# Patient Record
Sex: Female | Born: 1995 | Race: White | Hispanic: No | Marital: Single | Smoking: Former smoker
Health system: Southern US, Community
[De-identification: ages and names within clinical notes are randomized; demographics above are authoritative.]

## PROBLEM LIST (undated history)

## (undated) ENCOUNTER — Inpatient Hospital Stay (HOSPITAL_COMMUNITY): Payer: Self-pay

## (undated) DIAGNOSIS — N83209 Unspecified ovarian cyst, unspecified side: Secondary | ICD-10-CM

## (undated) DIAGNOSIS — F329 Major depressive disorder, single episode, unspecified: Secondary | ICD-10-CM

## (undated) DIAGNOSIS — A749 Chlamydial infection, unspecified: Secondary | ICD-10-CM

## (undated) DIAGNOSIS — Z789 Other specified health status: Secondary | ICD-10-CM

## (undated) DIAGNOSIS — B999 Unspecified infectious disease: Secondary | ICD-10-CM

## (undated) DIAGNOSIS — F32A Depression, unspecified: Secondary | ICD-10-CM

## (undated) HISTORY — PX: TONSILLECTOMY: SUR1361

---

## 2018-10-21 ENCOUNTER — Other Ambulatory Visit: Payer: Self-pay

## 2018-10-21 ENCOUNTER — Inpatient Hospital Stay (HOSPITAL_COMMUNITY)
Admission: AD | Admit: 2018-10-21 | Discharge: 2018-10-21 | Disposition: A | Discharge status: Home / Self Care | Payer: Medicaid Other | Attending: Obstetrics & Gynecology | Admitting: Obstetrics & Gynecology

## 2018-10-21 ENCOUNTER — Inpatient Hospital Stay (HOSPITAL_COMMUNITY): Payer: Medicaid Other

## 2018-10-21 ENCOUNTER — Encounter (HOSPITAL_COMMUNITY): Payer: Self-pay

## 2018-10-21 DIAGNOSIS — Z3A09 9 weeks gestation of pregnancy: Secondary | ICD-10-CM

## 2018-10-21 DIAGNOSIS — O219 Vomiting of pregnancy, unspecified: Secondary | ICD-10-CM

## 2018-10-21 DIAGNOSIS — R109 Unspecified abdominal pain: Secondary | ICD-10-CM | POA: Diagnosis not present

## 2018-10-21 DIAGNOSIS — Z87891 Personal history of nicotine dependence: Secondary | ICD-10-CM | POA: Diagnosis not present

## 2018-10-21 DIAGNOSIS — O210 Mild hyperemesis gravidarum: Secondary | ICD-10-CM | POA: Diagnosis not present

## 2018-10-21 DIAGNOSIS — E876 Hypokalemia: Secondary | ICD-10-CM

## 2018-10-21 DIAGNOSIS — O26891 Other specified pregnancy related conditions, first trimester: Secondary | ICD-10-CM | POA: Diagnosis not present

## 2018-10-21 DIAGNOSIS — O26899 Other specified pregnancy related conditions, unspecified trimester: Secondary | ICD-10-CM

## 2018-10-21 HISTORY — DX: Other specified health status: Z78.9

## 2018-10-21 LAB — COMPREHENSIVE METABOLIC PANEL
ALT: 13 U/L (ref 0–44)
AST: 17 U/L (ref 15–41)
Albumin: 3.5 g/dL (ref 3.5–5.0)
Alkaline Phosphatase: 40 U/L (ref 38–126)
Anion gap: 9 (ref 5–15)
BUN: 9 mg/dL (ref 6–20)
CO2: 25 mmol/L (ref 22–32)
Calcium: 8.7 mg/dL — ABNORMAL LOW (ref 8.9–10.3)
Chloride: 100 mmol/L (ref 98–111)
Creatinine, Ser: 0.56 mg/dL (ref 0.44–1.00)
GFR calc Af Amer: >60 mL/min (ref >60)
GFR calc non Af Amer: >60 mL/min (ref >60)
Glucose, Bld: 77 mg/dL (ref 70–99)
POTASSIUM: 3.4 mmol/L — AB (ref 3.5–5.1)
Sodium: 134 mmol/L — ABNORMAL LOW (ref 135–145)
Total Bilirubin: 0.6 mg/dL (ref 0.3–1.2)
Total Protein: 6.2 g/dL — ABNORMAL LOW (ref 6.5–8.1)

## 2018-10-21 LAB — URINALYSIS, ROUTINE W REFLEX MICROSCOPIC
Bilirubin Urine: NEGATIVE
Glucose, UA: NEGATIVE mg/dL
Hgb urine dipstick: NEGATIVE
Ketones, ur: 20 mg/dL — AB
Nitrite: NEGATIVE
Protein, ur: NEGATIVE mg/dL
Specific Gravity, Urine: 1.025 (ref 1.005–1.030)
pH: 6 (ref 5.0–8.0)

## 2018-10-21 LAB — HCG, QUANTITATIVE, PREGNANCY: hCG, Beta Chain, Quant, S: 122330 m[IU]/mL — ABNORMAL HIGH (ref <5)

## 2018-10-21 MED ORDER — LACTATED RINGERS IV BOLUS
1000.0000 mL | Freq: Once | INTRAVENOUS | Status: AC
  Administered 2018-10-21: 1000 mL via INTRAVENOUS

## 2018-10-21 MED ORDER — ONDANSETRON HCL 4 MG/2ML IJ SOLN
4.0000 mg | Freq: Once | INTRAMUSCULAR | Status: AC
  Administered 2018-10-21: 4 mg via INTRAVENOUS
  Filled 2018-10-21: qty 2

## 2018-10-21 MED ORDER — POTASSIUM CHLORIDE 10 MEQ/100ML IV SOLN
10.0000 meq | INTRAVENOUS | Status: AC
  Administered 2018-10-21 (×2): 10 meq via INTRAVENOUS
  Filled 2018-10-21 (×2): qty 100

## 2018-10-21 MED ORDER — ONDANSETRON 8 MG PO TBDP: 8.0000 mg | ORAL_TABLET | Freq: Three times a day (TID) | ORAL | 0 refills | Status: DC | PRN

## 2018-10-21 MED ORDER — PROMETHAZINE HCL 25 MG PO TABS: 25.0000 mg | ORAL_TABLET | Freq: Four times a day (QID) | ORAL | 0 refills | Status: DC | PRN

## 2018-10-21 MED ORDER — DEXTROSE 5 % IN LACTATED RINGERS IV BOLUS
1000.0000 mL | Freq: Once | INTRAVENOUS | Status: AC
  Administered 2018-10-21: 1000 mL via INTRAVENOUS

## 2018-10-21 MED ORDER — PROMETHAZINE HCL 25 MG/ML IJ SOLN
25.0000 mg | Freq: Once | INTRAMUSCULAR | Status: AC
  Administered 2018-10-21: 25 mg via INTRAVENOUS
  Filled 2018-10-21: qty 1

## 2018-10-21 NOTE — Discharge Instructions (Signed)

## 2018-10-21 NOTE — MAU Provider Note (Addendum)
History     CSN: [id number]  Arrival date and time: 10/21/18 1508   None     Chief Complaint  Patient presents with  . Emesis   HPI   Kara Barron is a 22 y.o. G1P0 female at 1731w1d by LMP who presents to MAU with emesis x3 weeks. Has not yet established care for pregnancy. She reports she has been vomiting throughout the day every day and is constantly nauseous. Endorses some abdominal pain and cramping, not able to categorize exact location or severity. Denies vaginal bleeding. Has not tried any medications. Has tried ginger, gingerale and lemon for nausea without any relief. Believes she has lost some weight but not sure how many lbs.   OB History    Gravida  1   Para      Term      Preterm      AB      Living        SAB      TAB      Ectopic      Multiple      Live Births              Past Medical History:  Diagnosis Date  . Medical history non-contributory     Past Surgical History:  Procedure Laterality Date  . TONSILLECTOMY      History reviewed. No pertinent family history.  Social History   Tobacco Use  . Smoking status: Former Games developermoker  . Smokeless tobacco: Never Used  Substance Use Topics  . Alcohol use: Not Currently  . Drug use: Never    Allergies: No Known Allergies  No medications prior to admission.    Review of Systems  Constitutional: Negative for chills and fever.  HENT: Negative for congestion and sore throat.   Respiratory: Negative for cough and shortness of breath.   Cardiovascular: Negative for chest pain.  Gastrointestinal: Positive for abdominal pain, nausea and vomiting. Negative for constipation and diarrhea.  Genitourinary: Negative for difficulty urinating, dysuria, flank pain, vaginal bleeding and vaginal discharge.  Musculoskeletal: Negative for back pain.  Skin: Negative for rash.  Neurological: Negative for dizziness and light-headedness.   Physical Exam   Blood pressure 128/88, pulse 85,  temperature 98.5 F (36.9 C), temperature source Oral, resp. rate 18, weight 59.5 kg, last menstrual period 08/18/2018, SpO2 100 %.  Physical Exam  Constitutional: She is oriented to person, place, and time. She appears well-developed and well-nourished. No distress.  Appears ill but non-toxic. Bucket on bed with clear emesis present.   HENT:  Head: Normocephalic and atraumatic.  Eyes: Conjunctivae and EOM are normal.  Neck: Normal range of motion. Neck supple.  Cardiovascular: Normal rate and regular rhythm.  Respiratory: Effort normal and breath sounds normal. No respiratory distress.  GI: Soft. Bowel sounds are normal. She exhibits no distension. There is no abdominal tenderness. There is no rebound and no guarding.  Musculoskeletal: Normal range of motion.        General: No edema.  Neurological: She is alert and oriented to person, place, and time.  Skin: Skin is warm and dry. She is not diaphoretic.  Psychiatric: She has a normal mood and affect. Her behavior is normal.    MAU Course  Procedures  MDM 1 LR bolus given. Phenergan 25 mg given. CMET with slightly low K at 3.4 and Na 134; otherwise unremarkable.    [place] Ob Comp Less 14 Wks  Result Date: 10/21/2018 CLINICAL DATA:  Pelvic pain  for 2 weeks. Gestational age by LMP of 9 weeks 1 day. EXAM: OBSTETRIC <14 WK ULTRASOUND TECHNIQUE: Transabdominal ultrasound was performed for evaluation of the gestation as well as the maternal uterus and adnexal regions. COMPARISON:  None. FINDINGS: Intrauterine gestational sac: Single Yolk sac:  Visualized. Embryo:  Visualized. Cardiac Activity: Visualized. Heart Rate: 180 bpm CRL:   24 mm   9 w 1 d                  US EDC: 05/25/2019 Subchorionic hemorrhage:  None visualized. Maternal uterus/adnexae: Normal appearance of left ovary. Right ovary not directly visualized, however no mass or abnormal free fluid identified. IMPRESSION: Single living IUP measuring 9 weeks 1 day, with US EDC of  05/25/2019. No significant maternal uterine or adnexal abnormality identified. Electronically Signed   By: Myles RosenthalJohn  Stahl M.D.   On: 10/21/2018 17:23   On re-assessment, patient feels improved but still ill. Subsequent 1L D5 LR bolus administered due to ketones on UA. 10 mEq KCl runs x2 ordered due to slightly low K.   7:56 PM Care transitioned to [facility] Francille Wittmann, CNM due to shift change.   De HollingsheadCatherine L Wallace 10/21/2018, 4:08 PM   Zofran 4mg  IV given Patient reporting relief from nausea.  1. Nausea and vomiting during pregnancy   2. Abdominal pain in pregnancy   3. Hypokalemia   4. [redacted] weeks gestation of pregnancy    -Discharge home in stable condition -Rx for zofran, phenergan sent to patient's pharmacy -First trimester precautions discussed -Patient advised to follow-up with OB of choice to start prenatal care -Patient may return to MAU as needed or if her condition were to change or worsen  Rolm BookbinderCaroline M Matisyn Cabeza, CNM 10/21/18 9:16 PM

## 2018-10-21 NOTE — MAU Note (Signed)
Has been vomiting for the past 3 wks.  Not on any meds.  Now vomiting blood.  Having a lot of burning.  Has not been seen or had confirmation.  Has appt next Monday.

## 2018-10-28 NOTE — L&D Delivery Note (Signed)
Delivery Note Pt reached complete/complete/+2, with pressure - pushed very well for 10-56min for delivery.  At 5:42 PM a viable and healthy female was delivered via Vaginal, Spontaneous (Presentation: OA; LOT ).  APGAR: 9, 9; weight P .   Placenta status: delivered, intact .  Cord: 3V with the following complications: none .    Anesthesia: epidural  Episiotomy: None Lacerations: 1st degree Suture Repair: 3.0 vicryl rapide Est. Blood Loss (mL): 182    Mom to postpartum.  Baby to Couplet care / Skin to Skin.  Kara Barron 05/12/2019, 6:08 PM  Br/RI/A+/Contra?/needs Tdap  circ in office

## 2018-11-10 LAB — OB RESULTS CONSOLE ABO/RH: RH Type: POSITIVE

## 2018-11-10 LAB — OB RESULTS CONSOLE RUBELLA ANTIBODY, IGM: Rubella: IMMUNE

## 2018-11-10 LAB — OB RESULTS CONSOLE RPR: RPR: NONREACTIVE

## 2018-11-10 LAB — OB RESULTS CONSOLE ANTIBODY SCREEN: Antibody Screen: NEGATIVE

## 2018-11-10 LAB — OB RESULTS CONSOLE GC/CHLAMYDIA
Chlamydia: NEGATIVE
Gonorrhea: NEGATIVE

## 2018-11-10 LAB — OB RESULTS CONSOLE HEPATITIS B SURFACE ANTIGEN: Hepatitis B Surface Ag: NEGATIVE

## 2018-11-10 LAB — OB RESULTS CONSOLE HIV ANTIBODY (ROUTINE TESTING): HIV: NONREACTIVE

## 2018-11-29 ENCOUNTER — Inpatient Hospital Stay (HOSPITAL_COMMUNITY)
Admission: AD | Admit: 2018-11-29 | Discharge: 2018-12-02 | DRG: 832 | Disposition: A | Discharge status: Home / Self Care | Payer: Medicaid Other | Attending: Obstetrics and Gynecology | Admitting: Obstetrics and Gynecology

## 2018-11-29 ENCOUNTER — Other Ambulatory Visit: Payer: Self-pay

## 2018-11-29 ENCOUNTER — Encounter (HOSPITAL_COMMUNITY): Payer: Self-pay | Admitting: *Deleted

## 2018-11-29 DIAGNOSIS — O210 Mild hyperemesis gravidarum: Secondary | ICD-10-CM | POA: Diagnosis present

## 2018-11-29 DIAGNOSIS — O211 Hyperemesis gravidarum with metabolic disturbance: Secondary | ICD-10-CM

## 2018-11-29 DIAGNOSIS — K920 Hematemesis: Secondary | ICD-10-CM | POA: Diagnosis present

## 2018-11-29 DIAGNOSIS — Z3A14 14 weeks gestation of pregnancy: Secondary | ICD-10-CM

## 2018-11-29 DIAGNOSIS — E876 Hypokalemia: Secondary | ICD-10-CM | POA: Diagnosis present

## 2018-11-29 DIAGNOSIS — Z87891 Personal history of nicotine dependence: Secondary | ICD-10-CM

## 2018-11-29 LAB — COMPREHENSIVE METABOLIC PANEL
ALT: 43 U/L (ref 0–44)
AST: 35 U/L (ref 15–41)
Albumin: 3.8 g/dL (ref 3.5–5.0)
Alkaline Phosphatase: 47 U/L (ref 38–126)
Anion gap: 12 (ref 5–15)
BUN: 8 mg/dL (ref 6–20)
CO2: 22 mmol/L (ref 22–32)
Calcium: 8.8 mg/dL — ABNORMAL LOW (ref 8.9–10.3)
Chloride: 100 mmol/L (ref 98–111)
Creatinine, Ser: 0.55 mg/dL (ref 0.44–1.00)
GFR calc Af Amer: >60 mL/min (ref >60)
Glucose, Bld: 124 mg/dL — ABNORMAL HIGH (ref 70–99)
Potassium: 3.1 mmol/L — ABNORMAL LOW (ref 3.5–5.1)
Sodium: 134 mmol/L — ABNORMAL LOW (ref 135–145)
Total Bilirubin: 0.7 mg/dL (ref 0.3–1.2)
Total Protein: 7.4 g/dL (ref 6.5–8.1)

## 2018-11-29 LAB — CBC
HCT: 41 % (ref 36.0–46.0)
Hemoglobin: 14.6 g/dL (ref 12.0–15.0)
MCH: 31.1 pg (ref 26.0–34.0)
MCHC: 35.6 g/dL (ref 30.0–36.0)
MCV: 87.4 fL (ref 80.0–100.0)
Platelets: 313 10*3/uL (ref 150–400)
RBC: 4.69 MIL/uL (ref 3.87–5.11)
RDW: 12.6 % (ref 11.5–15.5)
WBC: 14.1 10*3/uL — ABNORMAL HIGH (ref 4.0–10.5)
nRBC: 0 % (ref 0.0–0.2)

## 2018-11-29 LAB — URINALYSIS, ROUTINE W REFLEX MICROSCOPIC
Bilirubin Urine: NEGATIVE
Glucose, UA: NEGATIVE mg/dL
Hgb urine dipstick: NEGATIVE
Ketones, ur: >80 mg/dL — AB
Leukocytes, UA: NEGATIVE
Nitrite: NEGATIVE
Protein, ur: 30 mg/dL — AB
Specific Gravity, Urine: >1.03 — ABNORMAL HIGH (ref 1.005–1.030)
pH: 6 (ref 5.0–8.0)

## 2018-11-29 LAB — URINALYSIS, MICROSCOPIC (REFLEX)

## 2018-11-29 MED ORDER — DEXTROSE 5 % IN LACTATED RINGERS IV BOLUS
1000.0000 mL | Freq: Once | INTRAVENOUS | Status: AC
  Administered 2018-11-29: 1000 mL via INTRAVENOUS

## 2018-11-29 MED ORDER — METHYLPREDNISOLONE 4 MG PO TABS: 8.0000 mg | ORAL_TABLET | Freq: Every day | ORAL | Status: DC

## 2018-11-29 MED ORDER — METHYLPREDNISOLONE 4 MG PO TABS: 4.0000 mg | ORAL_TABLET | Freq: Every day | ORAL | Status: DC

## 2018-11-29 MED ORDER — METHYLPREDNISOLONE 16 MG PO TABS
16.0000 mg | ORAL_TABLET | Freq: Every day | ORAL | Status: DC
  Administered 2018-11-30 – 2018-12-01 (×2): 16 mg via ORAL
  Filled 2018-11-29 (×4): qty 1

## 2018-11-29 MED ORDER — METOCLOPRAMIDE HCL 5 MG/ML IJ SOLN
10.0000 mg | Freq: Three times a day (TID) | INTRAMUSCULAR | Status: DC | PRN
  Administered 2018-11-29 – 2018-12-01 (×5): 10 mg via INTRAVENOUS
  Filled 2018-11-29 (×5): qty 2

## 2018-11-29 MED ORDER — PROMETHAZINE HCL 25 MG/ML IJ SOLN
12.5000 mg | INTRAMUSCULAR | Status: DC | PRN
  Administered 2018-11-29 – 2018-11-30 (×4): 12.5 mg via INTRAVENOUS
  Filled 2018-11-29 (×4): qty 1

## 2018-11-29 MED ORDER — SODIUM CHLORIDE 0.9 % IV SOLN
8.0000 mg | Freq: Once | INTRAVENOUS | Status: AC
  Administered 2018-11-29: 8 mg via INTRAVENOUS
  Filled 2018-11-29: qty 4

## 2018-11-29 MED ORDER — METHYLPREDNISOLONE 16 MG PO TABS
16.0000 mg | ORAL_TABLET | Freq: Every day | ORAL | Status: AC
  Administered 2018-11-30 – 2018-12-01 (×2): 16 mg via ORAL
  Filled 2018-11-29 (×2): qty 1

## 2018-11-29 MED ORDER — LACTATED RINGERS IV BOLUS
1000.0000 mL | Freq: Once | INTRAVENOUS | Status: AC
  Administered 2018-11-29: 1000 mL via INTRAVENOUS

## 2018-11-29 MED ORDER — PROMETHAZINE HCL 25 MG/ML IJ SOLN
25.0000 mg | Freq: Once | INTRAVENOUS | Status: AC
  Administered 2018-11-29: 25 mg via INTRAVENOUS
  Filled 2018-11-29: qty 1

## 2018-11-29 MED ORDER — POTASSIUM CHLORIDE 10 MEQ/100ML IV SOLN
10.0000 meq | INTRAVENOUS | Status: DC
  Filled 2018-11-29 (×4): qty 100

## 2018-11-29 MED ORDER — METHYLPREDNISOLONE 4 MG PO TABS
8.0000 mg | ORAL_TABLET | Freq: Every day | ORAL | Status: DC
  Administered 2018-12-02: 8 mg via ORAL
  Filled 2018-11-29 (×2): qty 2

## 2018-11-29 MED ORDER — DEXTROSE IN LACTATED RINGERS 5 % IV SOLN
INTRAVENOUS | Status: DC
  Administered 2018-11-29 – 2018-12-02 (×6): via INTRAVENOUS

## 2018-11-29 MED ORDER — METHYLPREDNISOLONE 16 MG PO TABS
16.0000 mg | ORAL_TABLET | Freq: Every day | ORAL | Status: DC
  Administered 2018-11-30 – 2018-12-01 (×2): 16 mg via ORAL
  Filled 2018-11-29 (×3): qty 1

## 2018-11-29 MED ORDER — METHYLPREDNISOLONE SODIUM SUCC 125 MG IJ SOLR
48.0000 mg | Freq: Once | INTRAMUSCULAR | Status: AC
  Administered 2018-11-29: 48 mg via INTRAVENOUS
  Filled 2018-11-29: qty 0.77

## 2018-11-29 MED ORDER — PANTOPRAZOLE SODIUM 40 MG IV SOLR
40.0000 mg | Freq: Once | INTRAVENOUS | Status: AC
  Administered 2018-11-29: 40 mg via INTRAVENOUS
  Filled 2018-11-29: qty 40

## 2018-11-29 MED ORDER — POTASSIUM CHLORIDE 10 MEQ/100ML IV SOLN
10.0000 meq | INTRAVENOUS | Status: AC
  Administered 2018-11-29 – 2018-11-30 (×4): 10 meq via INTRAVENOUS
  Filled 2018-11-29 (×4): qty 100

## 2018-11-29 NOTE — Progress Notes (Signed)
Pharmacy Consult:   MEDROL (METHYLPREDNISOLONE) TAPER  FOR HYPEREMESIS GRAVIDARUM PATIENTS  The following is a 14 day taper of methylprednisolone for hyperemesis. Doses on day 1  will be given IV.  All doses starting on day 2  will be given PO. (If patient cannot tolerate oral medications, contact the pharmacy to change route to IV.)   Date Day Morning Midday Bedtime  2/2 1 - - 48 mg  2/3 2 16  mg 16 mg 16 mg  2/4 3 16  mg 16 mg 16 mg  2/5 4 16  mg 8 mg 16 mg  2/6 5 16  mg 8 mg 8 mg  2/7 6 8  mg 8 mg 8 mg  2/8 7 8  mg 4 mg 8 mg  2/9 8 8  mg 4 mg 4 mg  2/10 9 8  mg 4 mg   2/11 10 8  mg 4 mg   2/12 11 8  mg    2/13 12 8  mg    2/14 13 4  mg    2/15 14 4  mg     Check fasting blood sugars daily while on the taper. Notify MD if fasting blood sugar>95.  Kara Barron 11/29/2018

## 2018-11-29 NOTE — H&P (Signed)
Maha Rearden is a 23 y.o. female G1P0 at 44 5/7 weeks (EDD 05/25/19 by LMP c/w 9 week [place])  presenting for persistent nausea and vomiting x 2 days.  Prenatal care has been significant for hyperemesis but she has been able to control it at home with a scheduled regimen of phenergan and diclegis until recently ran out of meds.  She was hydrated in MAU with 2 liters of LR and given multiple doses of antiemetics without resolution of N/V so will be admitted for further hydration.  Prenatal care otherwise uncomplicated except for h/o depression, stable off medications.  OB History    Gravida  1   Para      Term      Preterm      AB      Living        SAB      TAB      Ectopic      Multiple      Live Births             Past Medical History:  Diagnosis Date  . Medical history non-contributory    Past Surgical History:  Procedure Laterality Date  . TONSILLECTOMY     Family History: family history is not on file. Social History:  reports that she has quit smoking. She has never used smokeless tobacco. She reports previous alcohol use. She reports that she does not use drugs.        Review of Systems  Eyes: Negative for blurred vision.  Gastrointestinal: Positive for nausea and vomiting.   Maternal Medical History:  Reason for admission: Nausea.       Blood pressure 130/82, pulse 90, resp. rate 17, last menstrual period 08/18/2018, SpO2 99 %. Exam Physical Exam  Prenatal labs: ABO, Rh:  A positive Antibody:  negative Rubella:  Immune RPR:   NR HBsAg:   Neg HIV:   NR   Assessment/Plan: Pt admitted for continued hydration and antiemetics.  Had zofran and phenergan in MAU so will try reglan and begin steroid taper.    Oliver Pila 11/29/2018, 7:38 PM

## 2018-11-29 NOTE — MAU Provider Note (Signed)
Chief Complaint: Emesis and Nausea   First Provider Initiated Contact with Patient 11/29/18 1357     SUBJECTIVE HPI: Kara Barron is a 23 y.o. G1P0 at 8442w5d who presents to Maternity Admissions reporting exacerbation of nausea and vomiting of pregnancy and new-onset vomiting dark red blood. Symptoms not adequately controlled with scheduled Zofran, Diclegis and Phenergan. She ran out and was hoping she could make it through until her office visit tomorrow at [facility].    Associated signs and symptoms: Negative for fever, chills, diarrhea, constipation, blood in stools. Unable to void.   Past Medical History:  Diagnosis Date  . Medical history non-contributory    OB History  Gravida Para Term Preterm AB Living  1            SAB TAB Ectopic Multiple Live Births               # Outcome Date GA Lbr Len/2nd Weight Sex Delivery Anes PTL Lv  1 Current            Past Surgical History:  Procedure Laterality Date  . TONSILLECTOMY     Social History   Socioeconomic History  . Marital status: Single    Spouse name: Not on file  . Number of children: Not on file  . Years of education: Not on file  . Highest education level: Not on file  Occupational History  . Not on file  Social Needs  . Financial resource strain: Not on file  . Food insecurity:    Worry: Not on file    Inability: Not on file  . Transportation needs:    Medical: Not on file    Non-medical: Not on file  Tobacco Use  . Smoking status: Former Games developermoker  . Smokeless tobacco: Never Used  Substance and Sexual Activity  . Alcohol use: Not Currently  . Drug use: Never  . Sexual activity: Yes    Birth control/protection: None  Lifestyle  . Physical activity:    Days per week: Not on file    Minutes per session: Not on file  . Stress: Not on file  Relationships  . Social connections:    Talks on phone: Not on file    Gets together: Not on file    Attends religious service: Not on file    Active  member of club or organization: Not on file    Attends meetings of clubs or organizations: Not on file    Relationship status: Not on file  . Intimate partner violence:    Fear of current or ex partner: Not on file    Emotionally abused: Not on file    Physically abused: Not on file    Forced sexual activity: Not on file  Other Topics Concern  . Not on file  Social History Narrative  . Not on file   History reviewed. No pertinent family history. No current facility-administered medications on file prior to encounter.    Current Outpatient Medications on File Prior to Encounter  Medication Sig Dispense Refill  . ondansetron (ZOFRAN ODT) 8 MG disintegrating tablet Take 1 tablet (8 mg total) by mouth every 8 (eight) hours as needed for nausea or vomiting. 30 tablet 0  . promethazine (PHENERGAN) 25 MG tablet Take 1 tablet (25 mg total) by mouth every 6 (six) hours as needed for nausea or vomiting. 30 tablet 0   No Known Allergies  I have reviewed patient's Past Medical Hx, Surgical Hx, Family Hx, Social Hx,  medications and allergies.   Review of Systems  Constitutional: Negative for appetite change, chills and fever.  Gastrointestinal: Positive for nausea and vomiting. Negative for abdominal pain, blood in stool, constipation and diarrhea.       Positive for what appears to be dark red blood in emesis  Genitourinary: Negative for vaginal bleeding.    OBJECTIVE Patient Vitals for the past 24 hrs:  BP Temp Pulse Resp SpO2  11/29/18 1419 130/82 - 90 17 99 %     Constitutional: Well-developed, well-nourished female in distress. Vomiting repeatedly.  Cardiovascular: normal rate Respiratory: normal rate and effort.  GI: Deferred Neurologic: Alert and oriented x 4.  GU: Deferred  Fetal heart rate 155 by Doppler  LAB RESULTS Results for orders placed or performed during the hospital encounter of 11/29/18 (from the past 24 hour(s))  Urinalysis, Routine w reflex microscopic      Status: Abnormal   Collection Time: 11/29/18  1:48 PM  Result Value Ref Range   Color, Urine YELLOW YELLOW   APPearance CLOUDY (A) CLEAR   Specific Gravity, Urine >1.030 (H) 1.005 - 1.030   pH 6.0 5.0 - 8.0   Glucose, UA NEGATIVE NEGATIVE mg/dL   Hgb urine dipstick NEGATIVE NEGATIVE   Bilirubin Urine NEGATIVE NEGATIVE   Ketones, ur >80 (A) NEGATIVE mg/dL   Protein, ur 30 (A) NEGATIVE mg/dL   Nitrite NEGATIVE NEGATIVE   Leukocytes, UA NEGATIVE NEGATIVE  Urinalysis, Microscopic (reflex)     Status: Abnormal   Collection Time: 11/29/18  1:48 PM  Result Value Ref Range   RBC / HPF 0-5 0 - 5 RBC/hpf   WBC, UA 0-5 0 - 5 WBC/hpf   Bacteria, UA FEW (A) NONE SEEN   Squamous Epithelial / LPF 11-20 0 - 5   Amorphous Crystal PRESENT   CBC     Status: Abnormal   Collection Time: 11/29/18  2:16 PM  Result Value Ref Range   WBC 14.1 (H) 4.0 - 10.5 K/uL   RBC 4.69 3.87 - 5.11 MIL/uL   Hemoglobin 14.6 12.0 - 15.0 g/dL   HCT 19.1 47.8 - 29.5 %   MCV 87.4 80.0 - 100.0 fL   MCH 31.1 26.0 - 34.0 pg   MCHC 35.6 30.0 - 36.0 g/dL   RDW 62.1 30.8 - 65.7 %   Platelets 313 150 - 400 K/uL   nRBC 0.0 0.0 - 0.2 %  Comprehensive metabolic panel     Status: Abnormal   Collection Time: 11/29/18  2:35 PM  Result Value Ref Range   Sodium 134 (L) 135 - 145 mmol/L   Potassium 3.1 (L) 3.5 - 5.1 mmol/L   Chloride 100 98 - 111 mmol/L   CO2 22 22 - 32 mmol/L   Glucose, Bld 124 (H) 70 - 99 mg/dL   BUN 8 6 - 20 mg/dL   Creatinine, Ser 8.46 0.44 - 1.00 mg/dL   Calcium 8.8 (L) 8.9 - 10.3 mg/dL   Total Protein 7.4 6.5 - 8.1 g/dL   Albumin 3.8 3.5 - 5.0 g/dL   AST 35 15 - 41 U/L   ALT 43 0 - 44 U/L   Alkaline Phosphatase 47 38 - 126 U/L   Total Bilirubin 0.7 0.3 - 1.2 mg/dL   GFR calc non Af Amer >60 >60 mL/min   GFR calc Af Amer >60 >60 mL/min   Anion gap 12 5 - 15    IMAGING No results found.  MAU COURSE Orders Placed This Encounter  Procedures  .  CBC  . Comprehensive metabolic panel  .  Urinalysis, Routine w reflex microscopic  . Insert peripheral IV   Meds ordered this encounter  Medications  . promethazine (PHENERGAN) 25 mg in lactated ringers 1,000 mL infusion  . ondansetron (ZOFRAN) 8 mg in sodium chloride 0.9 % 50 mL IVPB  . lactated ringers bolus 1,000 mL  . pantoprazole (PROTONIX) injection 40 mg  . ondansetron (ZOFRAN) 8 mg in sodium chloride 0.9 % 50 mL IVPB  . dextrose 5% lactated ringers bolus 1,000 mL  . potassium chloride 10 mEq in 100 mL IVPB   Vomiting stopped for ~ 1.5 hours. Pt sleeping during that time. Woke up and said she was still very nauseous, then started vomiting. Only voided tiny amount since receiving 2 liters of LR.    Discussed Hx, labs, exam w/ Dr. Erin Fulling. Agrees w/ POC. Recommend discussing admission with [facility] on-call physician. Will replace K.   Discussed Hx, labs, exam w/ Dr. Senaida Ores. Will admit to 3rd floor.   MDM - Hyperemesis uncontrolled w/ Phenergan, two doses of Zofran. Actively vomiting.  - Possible hematemesis. Not C/W Mallory-Weiss Tear. - Hypokalemia. Replace K.    ASSESSMENT 1. Hyperemesis gravidarum before end of [redacted] week gestation with electrolyte imbalance   2.      Hypokalemia 3.      Hematemesis   PLAN Admit to third floor per consult w/ Dr. Senaida Ores. Medrol taper Replace Kevin Fenton, [place], [place] 11/29/2018  6:13 PM

## 2018-11-29 NOTE — MAU Note (Signed)
Pt states she has had severe nausea and vomiting the whole pregnancy.  She was on medications but ran out and it has gotten worse.  Denies LOF/VB/pain.

## 2018-11-30 LAB — COMPREHENSIVE METABOLIC PANEL
ALT: 58 U/L — ABNORMAL HIGH (ref 0–44)
AST: 39 U/L (ref 15–41)
Albumin: 3.3 g/dL — ABNORMAL LOW (ref 3.5–5.0)
Alkaline Phosphatase: 43 U/L (ref 38–126)
Anion gap: 8 (ref 5–15)
CO2: 23 mmol/L (ref 22–32)
Calcium: 8.3 mg/dL — ABNORMAL LOW (ref 8.9–10.3)
Chloride: 106 mmol/L (ref 98–111)
Creatinine, Ser: 0.45 mg/dL (ref 0.44–1.00)
GFR calc Af Amer: >60 mL/min (ref >60)
GFR calc non Af Amer: >60 mL/min (ref >60)
Glucose, Bld: 129 mg/dL — ABNORMAL HIGH (ref 70–99)
POTASSIUM: 2.7 mmol/L — AB (ref 3.5–5.1)
Sodium: 137 mmol/L (ref 135–145)
Total Bilirubin: 0.4 mg/dL (ref 0.3–1.2)
Total Protein: 6.4 g/dL — ABNORMAL LOW (ref 6.5–8.1)

## 2018-11-30 LAB — GLUCOSE, RANDOM: Glucose, Bld: 147 mg/dL — ABNORMAL HIGH (ref 70–99)

## 2018-11-30 LAB — GLUCOSE, CAPILLARY: Glucose-Capillary: 109 mg/dL — ABNORMAL HIGH (ref 70–99)

## 2018-11-30 MED ORDER — ONDANSETRON 4 MG PO TBDP
4.0000 mg | ORAL_TABLET | Freq: Once | ORAL | Status: AC
  Administered 2018-11-30: 4 mg via ORAL
  Filled 2018-11-30: qty 1

## 2018-11-30 MED ORDER — SODIUM CHLORIDE 0.9 % IV SOLN
8.0000 mg | Freq: Once | INTRAVENOUS | Status: AC
  Administered 2018-12-01: 8 mg via INTRAVENOUS
  Filled 2018-11-30: qty 4

## 2018-11-30 MED ORDER — PROMETHAZINE HCL 25 MG/ML IJ SOLN
25.0000 mg | INTRAMUSCULAR | Status: DC | PRN
  Administered 2018-12-01 (×2): 25 mg via INTRAVENOUS
  Filled 2018-11-30 (×2): qty 1

## 2018-11-30 MED ORDER — THIAMINE HCL 100 MG/ML IJ SOLN
Freq: Once | INTRAVENOUS | Status: AC
  Administered 2018-12-01: via INTRAVENOUS
  Filled 2018-11-30: qty 1000

## 2018-11-30 NOTE — Progress Notes (Addendum)
Patient ID: Kara Barron, female   DOB: 1996-06-17, 23 y.o.   MRN: [id number] Pt has been unable to tolerate liquids PO. Dry heaving now. Feels weak. Scared to eat VS - 100/59  A/P: Prime at 14 6/7wks with hyperemesis         Recheck labs now and in am         Continue on dextrose LR         Continue with medrol ( 16mg  today and for next 4 days then taper to 8mg  x 1 week then 4mg  for 2 days)          Increase phenergan to 25mg  and add zofran ; reglan prn          Change to regular diet but bland    Counseled pt on importance of attempting to eat; symptoms will worsen otherwise    Switch to bland diet: pt to order meal            FHTs q shift

## 2018-11-30 NOTE — Progress Notes (Signed)
Patient ID: Kara Barron, female   DOB: 1996/10/20, 23 y.o.   MRN: [id number] Pt doing well. No vomiting >3hrs. Has not eaten anything though - scared to VSS GEN - NAD  A/P: 22yo prime at 67 6/[redacted]wks gestation with hyperemesis - now stable         Encouraged pr to eat this am - try clears then advance by lunch         If able to tolerate po, will discharge to home today         Adjust anti nausea meds accordingly; continue steroids

## 2018-11-30 NOTE — Progress Notes (Signed)
Pt crying and vomiting in trash can.  Reglan 10 mg Slow IVP given.

## 2018-12-01 DIAGNOSIS — O210 Mild hyperemesis gravidarum: Secondary | ICD-10-CM | POA: Diagnosis present

## 2018-12-01 DIAGNOSIS — Z3A14 14 weeks gestation of pregnancy: Secondary | ICD-10-CM | POA: Diagnosis not present

## 2018-12-01 DIAGNOSIS — K920 Hematemesis: Secondary | ICD-10-CM | POA: Diagnosis present

## 2018-12-01 DIAGNOSIS — O211 Hyperemesis gravidarum with metabolic disturbance: Secondary | ICD-10-CM | POA: Diagnosis present

## 2018-12-01 DIAGNOSIS — Z87891 Personal history of nicotine dependence: Secondary | ICD-10-CM | POA: Diagnosis not present

## 2018-12-01 LAB — COMPREHENSIVE METABOLIC PANEL
ALT: 65 U/L — ABNORMAL HIGH (ref 0–44)
ANION GAP: 7 (ref 5–15)
AST: 43 U/L — ABNORMAL HIGH (ref 15–41)
Albumin: 3.4 g/dL — ABNORMAL LOW (ref 3.5–5.0)
Alkaline Phosphatase: 37 U/L — ABNORMAL LOW (ref 38–126)
BUN: <5 mg/dL — ABNORMAL LOW (ref 6–20)
CO2: 24 mmol/L (ref 22–32)
Calcium: 8.3 mg/dL — ABNORMAL LOW (ref 8.9–10.3)
Chloride: 103 mmol/L (ref 98–111)
Creatinine, Ser: 0.42 mg/dL — ABNORMAL LOW (ref 0.44–1.00)
GFR calc Af Amer: >60 mL/min (ref >60)
GFR calc non Af Amer: >60 mL/min (ref >60)
GLUCOSE: 124 mg/dL — AB (ref 70–99)
POTASSIUM: 2.9 mmol/L — AB (ref 3.5–5.1)
Sodium: 134 mmol/L — ABNORMAL LOW (ref 135–145)
TOTAL PROTEIN: 6.3 g/dL — AB (ref 6.5–8.1)
Total Bilirubin: 0.6 mg/dL (ref 0.3–1.2)

## 2018-12-01 LAB — GLUCOSE, CAPILLARY: Glucose-Capillary: 111 mg/dL — ABNORMAL HIGH (ref 70–99)

## 2018-12-01 MED ORDER — PANTOPRAZOLE SODIUM 40 MG IV SOLR
40.0000 mg | Freq: Two times a day (BID) | INTRAVENOUS | Status: DC
  Administered 2018-12-01 – 2018-12-02 (×3): 40 mg via INTRAVENOUS
  Filled 2018-12-01 (×5): qty 40

## 2018-12-01 MED ORDER — ONDANSETRON HCL 4 MG/2ML IJ SOLN
4.0000 mg | Freq: Three times a day (TID) | INTRAMUSCULAR | Status: DC
  Administered 2018-12-01 – 2018-12-02 (×4): 4 mg via INTRAVENOUS
  Filled 2018-12-01 (×4): qty 2

## 2018-12-01 MED ORDER — POTASSIUM CHLORIDE 10 MEQ/100ML IV SOLN
10.0000 meq | INTRAVENOUS | Status: AC
  Administered 2018-12-01 (×4): 10 meq via INTRAVENOUS
  Filled 2018-12-01 (×4): qty 100

## 2018-12-01 MED ORDER — CHLORPROMAZINE HCL 25 MG/ML IJ SOLN
25.0000 mg | Freq: Three times a day (TID) | INTRAMUSCULAR | Status: DC
  Administered 2018-12-01 (×2): 25 mg via INTRAMUSCULAR
  Filled 2018-12-01 (×4): qty 1

## 2018-12-01 NOTE — Progress Notes (Addendum)
HD #2, [redacted]W[redacted]D, hyperemesis Still not feeling good, finally resting, reports last emesis at 0530, trash can by the bed Afeb, VSS Labs- K+ still low at 2.9, LFTs slightly elevated Will continue Medrol taper, Phenergan prn, will give zofran every 8 hrs scheduled as was just ordered one time, will give IV K+ since not tolerating PO yet.  Reassess later today

## 2018-12-01 NOTE — Progress Notes (Signed)
Maybe feeling a bit better, but has had emesis since I saw her this morning.  Having esophageal burning Will continue scheduled Medrol, Reglan and Zofran, d/c phenergan to avoid dystonic rxn, add IV protonix, finish IV K+

## 2018-12-01 NOTE — Progress Notes (Signed)
No significant improvement so far Will d/c Reglan and try thorazine, continue Medrol and zofran Recheck CMP in am

## 2018-12-02 LAB — COMPREHENSIVE METABOLIC PANEL
ALT: 96 U/L — ABNORMAL HIGH (ref 0–44)
ALT: 110 U/L — ABNORMAL HIGH (ref 0–44)
AST: 66 U/L — ABNORMAL HIGH (ref 15–41)
AST: 61 U/L — ABNORMAL HIGH (ref 15–41)
Albumin: 3 g/dL — ABNORMAL LOW (ref 3.5–5.0)
Albumin: 3.1 g/dL — ABNORMAL LOW (ref 3.5–5.0)
Alkaline Phosphatase: 40 U/L (ref 38–126)
Alkaline Phosphatase: 45 U/L (ref 38–126)
Anion gap: 8 (ref 5–15)
Anion gap: 5 (ref 5–15)
BUN: <5 mg/dL — ABNORMAL LOW (ref 6–20)
BUN: <5 mg/dL — ABNORMAL LOW (ref 6–20)
CHLORIDE: 103 mmol/L (ref 98–111)
CO2: 23 mmol/L (ref 22–32)
CO2: 24 mmol/L (ref 22–32)
Calcium: 8.1 mg/dL — ABNORMAL LOW (ref 8.9–10.3)
Calcium: 8.3 mg/dL — ABNORMAL LOW (ref 8.9–10.3)
Chloride: 106 mmol/L (ref 98–111)
Creatinine, Ser: 0.48 mg/dL (ref 0.44–1.00)
Creatinine, Ser: 0.37 mg/dL — ABNORMAL LOW (ref 0.44–1.00)
GFR calc Af Amer: >60 mL/min (ref >60)
GFR calc non Af Amer: >60 mL/min (ref >60)
Glucose, Bld: 96 mg/dL (ref 70–99)
Glucose, Bld: 99 mg/dL (ref 70–99)
Potassium: 2.7 mmol/L — CL (ref 3.5–5.1)
Potassium: 4 mmol/L (ref 3.5–5.1)
SODIUM: 135 mmol/L (ref 135–145)
Sodium: 134 mmol/L — ABNORMAL LOW (ref 135–145)
Total Bilirubin: 0.5 mg/dL (ref 0.3–1.2)
Total Bilirubin: 0.6 mg/dL (ref 0.3–1.2)
Total Protein: 5.8 g/dL — ABNORMAL LOW (ref 6.5–8.1)
Total Protein: 5.7 g/dL — ABNORMAL LOW (ref 6.5–8.1)

## 2018-12-02 LAB — GLUCOSE, CAPILLARY: Glucose-Capillary: 99 mg/dL (ref 70–99)

## 2018-12-02 MED ORDER — POTASSIUM CHLORIDE CRYS ER 20 MEQ PO TBCR
40.0000 meq | EXTENDED_RELEASE_TABLET | Freq: Three times a day (TID) | ORAL | Status: DC
  Administered 2018-12-02: 40 meq via ORAL
  Filled 2018-12-02 (×4): qty 2

## 2018-12-02 MED ORDER — PANTOPRAZOLE SODIUM 40 MG PO TBEC: 40.0000 mg | DELAYED_RELEASE_TABLET | Freq: Two times a day (BID) | ORAL | Status: DC

## 2018-12-02 MED ORDER — METHYLPREDNISOLONE 4 MG PO TABS: ORAL_TABLET | ORAL | 0 refills | Status: DC

## 2018-12-02 MED ORDER — METHYLPREDNISOLONE 8 MG PO TABS: ORAL_TABLET | ORAL | 0 refills | Status: DC

## 2018-12-02 MED ORDER — ONDANSETRON 8 MG PO TBDP: 8.0000 mg | ORAL_TABLET | Freq: Three times a day (TID) | ORAL | 3 refills | Status: DC | PRN

## 2018-12-02 MED ORDER — PANTOPRAZOLE SODIUM 40 MG PO TBEC: 40.0000 mg | DELAYED_RELEASE_TABLET | Freq: Two times a day (BID) | ORAL | 4 refills | Status: AC

## 2018-12-02 MED ORDER — POTASSIUM CHLORIDE 10 MEQ/100ML IV SOLN
10.0000 meq | INTRAVENOUS | Status: DC
  Administered 2018-12-02 (×5): 10 meq via INTRAVENOUS
  Filled 2018-12-02 (×6): qty 100

## 2018-12-02 MED ORDER — ONDANSETRON 8 MG PO TBDP
8.0000 mg | ORAL_TABLET | Freq: Three times a day (TID) | ORAL | Status: DC | PRN
  Administered 2018-12-02: 8 mg via ORAL
  Filled 2018-12-02 (×2): qty 1

## 2018-12-02 NOTE — Progress Notes (Signed)
Discharge instructions reviewed with pt and family. Verbalized understanding, denied any questions at this time.

## 2018-12-02 NOTE — Progress Notes (Signed)
Patient ID: Kara Barron, female   DOB: 06-27-1996, 23 y.o.   MRN: [id number] HD #3   Pt finally feeling some better this AM, no emesis thus far.  Ate small breakfast and tolerated thus far.  afeb vss abd soft NT  Current regimen is scheduled zofran q 8 hours IV and protonix.  Also steroid dose pack Will add oral Kdur and recheck potassium this PM.   If continues to do well will start transitioning IV to oral meds d/c thorazine

## 2018-12-02 NOTE — Progress Notes (Signed)
Patient ID: Kara Barron, female   DOB: Jun 19, 1996, 23 y.o.   MRN: [id number] Pt has had a good day with no emesis and tolerating po intake for breakfast and lunch.  About to try dinner.    She has had IV saline locked since lunch time and changed to all po meds.   afeb vss CMP repeated this PM and potassium up to 4.0, LFT's still slightly elevated but stable   Will d/c pt to home on current regimen of zofran scheduled, protonix and steroid taper.  She knows to call if begins to vomit again.  Has appt in office 12/09/18

## 2018-12-02 NOTE — Discharge Summary (Signed)
Physician Discharge Summary  Patient ID: Kara Barron MRN: [id number] DOB/AGE: [birth year] 23 y.o.  Admit date: 11/29/2018 Discharge date: 12/02/2018  Admission Diagnoses: Hyperemesis at 14 5/7 weeks Hypokalemia Discharge Diagnoses:  Active Problems:   Hyperemesis complicating pregnancy, antepartum   Hypokalemia due to excessive gastrointestinal loss of potassium   Hematemesis   Discharged Condition: good  Hospital Course: Pt was admitted with IV hydration and antiemetics which took over 48 hours to resolve.  She was begun on a steroid taper, and had several medications tried to control her vomiting.  Ultimately, scheduled zofran every 8 hours. Protonix, and the steroid taper controlled her vomiting.  She also had significant hypokalemia and received multiple K-runs and some oral Kdur to correct.  Discharge potassium was 4.0.  Her LFT's were slightly elevated, but stable, so will just f/u outpatient.    Consults: None  Significant Diagnostic Studies: labs: cbc cmp  Treatments: IV hydration  Discharge Exam: Blood pressure 99/60, pulse 95, temperature 98.9 F (37.2 C), temperature source Oral, resp. rate 18, height 5\' 3"  (1.6 m), weight 56.8 kg, last menstrual period 08/18/2018, SpO2 100 %. General appearance: alert and cooperative GI: soft NT  Disposition: Discharge disposition: 01-Home or Self Care       Discharge Instructions    Activity as tolerated   Complete by:  As directed    Call MD for:  persistant nausea and vomiting   Complete by:  As directed    Discharge instructions   Complete by:  As directed    Bland diet as tolerated.  Continue to drink plenty of clear liquids.     Allergies as of 12/02/2018   No Known Allergies     Medication List    STOP taking these medications   promethazine 25 MG tablet Commonly known as:  PHENERGAN     TAKE these medications   methylPREDNISolone 8 MG tablet Commonly known as:  MEDROL Take 2 tablets three x daily on  12/03/18 Take one tablet 3 times daily from 12/04/18-12/06/18   methylPREDNISolone 4 MG tablet Commonly known as:  MEDROL Take one tablet 3 times daily from 12/07/18-12/10/18 Take one tablet 2 times daily from 12/11/18-12/14/18   ondansetron 8 MG disintegrating tablet Commonly known as:  ZOFRAN ODT Take 1 tablet (8 mg total) by mouth every 8 (eight) hours as needed for nausea or vomiting.   pantoprazole 40 MG tablet Commonly known as:  PROTONIX Take 1 tablet (40 mg total) by mouth 2 (two) times daily.      Follow-up Information    Edwinna Areola, DO Follow up in 1 week(s).   Specialty:  Obstetrics and Gynecology Why:  Keep appointment as scheduled 12/09/18 Contact information: [address] [place] [address] [place] [address] [phone]           Signed: Oliver Pila 12/02/2018, 6:55 PM

## 2018-12-02 NOTE — Progress Notes (Signed)
CRITICAL VALUE ALERT  Critical Value: potassium 2.7  Date & Time Notied:  12-02-18 @ 0620  Provider Notified: Dr. Jackelyn Knife  Orders Received/Actions taken: 6 runs of Potassium

## 2018-12-19 ENCOUNTER — Inpatient Hospital Stay (HOSPITAL_COMMUNITY)
Admission: AD | Admit: 2018-12-19 | Discharge: 2018-12-19 | Disposition: A | Discharge status: Home / Self Care | Payer: Medicaid Other | Source: Ambulatory Visit | Attending: Obstetrics and Gynecology | Admitting: Obstetrics and Gynecology

## 2018-12-19 ENCOUNTER — Encounter (HOSPITAL_COMMUNITY): Payer: Self-pay

## 2018-12-19 ENCOUNTER — Other Ambulatory Visit: Payer: Self-pay

## 2018-12-19 DIAGNOSIS — Z3A17 17 weeks gestation of pregnancy: Secondary | ICD-10-CM | POA: Insufficient documentation

## 2018-12-19 DIAGNOSIS — O210 Mild hyperemesis gravidarum: Secondary | ICD-10-CM | POA: Diagnosis present

## 2018-12-19 DIAGNOSIS — Z87891 Personal history of nicotine dependence: Secondary | ICD-10-CM | POA: Insufficient documentation

## 2018-12-19 LAB — URINALYSIS, ROUTINE W REFLEX MICROSCOPIC
Bilirubin Urine: NEGATIVE
Glucose, UA: NEGATIVE mg/dL
Hgb urine dipstick: NEGATIVE
Ketones, ur: >80 mg/dL — AB
Nitrite: NEGATIVE
PROTEIN: NEGATIVE mg/dL
Specific Gravity, Urine: 1.025 (ref 1.005–1.030)
pH: 6.5 (ref 5.0–8.0)

## 2018-12-19 LAB — COMPREHENSIVE METABOLIC PANEL
ALT: 16 U/L (ref 0–44)
AST: 21 U/L (ref 15–41)
Albumin: 3.2 g/dL — ABNORMAL LOW (ref 3.5–5.0)
Alkaline Phosphatase: 53 U/L (ref 38–126)
Anion gap: 9 (ref 5–15)
BUN: 5 mg/dL — ABNORMAL LOW (ref 6–20)
CO2: 23 mmol/L (ref 22–32)
Calcium: 8.3 mg/dL — ABNORMAL LOW (ref 8.9–10.3)
Chloride: 102 mmol/L (ref 98–111)
Creatinine, Ser: 0.5 mg/dL (ref 0.44–1.00)
GFR calc Af Amer: >60 mL/min (ref >60)
GFR calc non Af Amer: >60 mL/min (ref >60)
Glucose, Bld: 311 mg/dL — ABNORMAL HIGH (ref 70–99)
POTASSIUM: 3.5 mmol/L (ref 3.5–5.1)
SODIUM: 134 mmol/L — AB (ref 135–145)
Total Bilirubin: 0.6 mg/dL (ref 0.3–1.2)
Total Protein: 6 g/dL — ABNORMAL LOW (ref 6.5–8.1)

## 2018-12-19 LAB — CBC
HCT: 37.9 % (ref 36.0–46.0)
Hemoglobin: 12.6 g/dL (ref 12.0–15.0)
MCH: 30.4 pg (ref 26.0–34.0)
MCHC: 33.2 g/dL (ref 30.0–36.0)
MCV: 91.5 fL (ref 80.0–100.0)
NRBC: 0 % (ref 0.0–0.2)
Platelets: 214 10*3/uL (ref 150–400)
RBC: 4.14 MIL/uL (ref 3.87–5.11)
RDW: 13.3 % (ref 11.5–15.5)
WBC: 10.6 10*3/uL — ABNORMAL HIGH (ref 4.0–10.5)

## 2018-12-19 LAB — URINALYSIS, MICROSCOPIC (REFLEX): RBC / HPF: NONE SEEN RBC/hpf (ref 0–5)

## 2018-12-19 MED ORDER — DEXTROSE 5 % IN LACTATED RINGERS IV BOLUS
1000.0000 mL | Freq: Once | INTRAVENOUS | Status: AC
  Administered 2018-12-19: 1000 mL via INTRAVENOUS

## 2018-12-19 MED ORDER — PROMETHAZINE HCL 25 MG/ML IJ SOLN
12.5000 mg | Freq: Once | INTRAMUSCULAR | Status: AC
  Administered 2018-12-19: 12.5 mg via INTRAVENOUS
  Filled 2018-12-19: qty 1

## 2018-12-19 MED ORDER — ONDANSETRON HCL 4 MG/2ML IJ SOLN
4.0000 mg | Freq: Once | INTRAMUSCULAR | Status: AC
  Administered 2018-12-19: 4 mg via INTRAVENOUS
  Filled 2018-12-19: qty 2

## 2018-12-19 MED ORDER — PROMETHAZINE HCL 25 MG PO TABS: 12.5000 mg | ORAL_TABLET | Freq: Every evening | ORAL | 5 refills | Status: DC | PRN

## 2018-12-19 MED ORDER — METOCLOPRAMIDE HCL 10 MG PO TABS: 10.0000 mg | ORAL_TABLET | Freq: Three times a day (TID) | ORAL | 2 refills | Status: DC

## 2018-12-19 MED ORDER — FAMOTIDINE IN NACL 20-0.9 MG/50ML-% IV SOLN
20.0000 mg | Freq: Once | INTRAVENOUS | Status: AC
  Administered 2018-12-19: 20 mg via INTRAVENOUS
  Filled 2018-12-19: qty 50

## 2018-12-19 MED ORDER — M.V.I. ADULT IV INJ
Freq: Once | INTRAVENOUS | Status: AC
  Administered 2018-12-19: 15:00:00 via INTRAVENOUS
  Filled 2018-12-19: qty 1000

## 2018-12-19 MED ORDER — ONDANSETRON 8 MG PO TBDP: 8.0000 mg | ORAL_TABLET | Freq: Three times a day (TID) | ORAL | 5 refills | Status: DC | PRN

## 2018-12-19 MED ORDER — LACTATED RINGERS IV SOLN
INTRAVENOUS | Status: DC
  Administered 2018-12-19: 13:00:00 via INTRAVENOUS

## 2018-12-19 NOTE — MAU Note (Signed)
Pt. Reports severe vomiting for the past 2 days. Pt denies vag bleeding or LOF.  Pt denies pain.

## 2018-12-19 NOTE — MAU Provider Note (Signed)
Chief Complaint: Nausea and Emesis   First Provider Initiated Contact with Patient 12/19/18 1336      SUBJECTIVE HPI: Kara Barron is a 23 y.o. G1P0 at [redacted]w[redacted]d who presents to maternity admissions reporting severe n/v since she stopped her steroid taper 2 days ago. She is managed for hyperemesis during this pregnancy. She has not taken any antiemetic medications since stopping the steroid. There are no other symptoms. She has not tried any treatments. She denies abdominal pain, vaginal bleeding, vaginal itching/burning, urinary symptoms, h/a, dizziness, or fever/chills.     HPI  Past Medical History:  Diagnosis Date  . Medical history non-contributory    Past Surgical History:  Procedure Laterality Date  . TONSILLECTOMY     Social History   Socioeconomic History  . Marital status: Single    Spouse name: Not on file  . Number of children: Not on file  . Years of education: Not on file  . Highest education level: Not on file  Occupational History  . Not on file  Social Needs  . Financial resource strain: Not on file  . Food insecurity:    Worry: Not on file    Inability: Not on file  . Transportation needs:    Medical: Not on file    Non-medical: Not on file  Tobacco Use  . Smoking status: Former Games developer  . Smokeless tobacco: Never Used  Substance and Sexual Activity  . Alcohol use: Not Currently  . Drug use: Never  . Sexual activity: Yes    Birth control/protection: None  Lifestyle  . Physical activity:    Days per week: Not on file    Minutes per session: Not on file  . Stress: Not on file  Relationships  . Social connections:    Talks on phone: Not on file    Gets together: Not on file    Attends religious service: Not on file    Active member of club or organization: Not on file    Attends meetings of clubs or organizations: Not on file    Relationship status: Not on file  . Intimate partner violence:    Fear of current or ex partner: Not on file     Emotionally abused: Not on file    Physically abused: Not on file    Forced sexual activity: Not on file  Other Topics Concern  . Not on file  Social History Narrative  . Not on file   No current facility-administered medications on file prior to encounter.    Current Outpatient Medications on File Prior to Encounter  Medication Sig Dispense Refill  . ondansetron (ZOFRAN ODT) 8 MG disintegrating tablet Take 1 tablet (8 mg total) by mouth every 8 (eight) hours as needed for nausea or vomiting. (Patient not taking: Reported on 11/30/2018) 30 tablet 0  . pantoprazole (PROTONIX) 40 MG tablet Take 1 tablet (40 mg total) by mouth 2 (two) times daily. 30 tablet 4   No Known Allergies  ROS:  Review of Systems  Constitutional: Negative for chills, fatigue and fever.  Respiratory: Negative for shortness of breath.   Cardiovascular: Negative for chest pain.  Gastrointestinal: Positive for nausea and vomiting.  Genitourinary: Negative for difficulty urinating, dysuria, flank pain, pelvic pain, vaginal bleeding, vaginal discharge and vaginal pain.  Neurological: Negative for dizziness and headaches.  Psychiatric/Behavioral: Negative.      I have reviewed patient's Past Medical Hx, Surgical Hx, Family Hx, Social Hx, medications and allergies.   Physical Exam  Patient Vitals for the past 24 hrs:  BP Temp Temp src Pulse Resp SpO2  12/19/18 1605 121/60 - - 80 18 -  12/19/18 1159 127/82 98.2 F (36.8 C) Oral 95 20 100 %   Constitutional: Well-developed, well-nourished female in no acute distress.  Cardiovascular: normal rate Respiratory: normal effort GI: Abd soft, non-tender. Pos BS x 4 MS: Extremities nontender, no edema, normal ROM Neurologic: Alert and oriented x 4.  GU: Neg CVAT.  FHT 150 by doppler  LAB RESULTS Results for orders placed or performed during the hospital encounter of 12/19/18 (from the past 24 hour(s))  Urinalysis, Routine w reflex microscopic     Status: Abnormal    Collection Time: 12/19/18 12:03 PM  Result Value Ref Range   Color, Urine YELLOW YELLOW   APPearance HAZY (A) CLEAR   Specific Gravity, Urine 1.025 1.005 - 1.030   pH 6.5 5.0 - 8.0   Glucose, UA NEGATIVE NEGATIVE mg/dL   Hgb urine dipstick NEGATIVE NEGATIVE   Bilirubin Urine NEGATIVE NEGATIVE   Ketones, ur >80 (A) NEGATIVE mg/dL   Protein, ur NEGATIVE NEGATIVE mg/dL   Nitrite NEGATIVE NEGATIVE   Leukocytes,Ua TRACE (A) NEGATIVE  Urinalysis, Microscopic (reflex)     Status: Abnormal   Collection Time: 12/19/18 12:03 PM  Result Value Ref Range   RBC / HPF NONE SEEN 0 - 5 RBC/hpf   WBC, UA 6-10 0 - 5 WBC/hpf   Bacteria, UA MANY (A) NONE SEEN   Squamous Epithelial / LPF 21-50 0 - 5   Mucus PRESENT    Budding Yeast PRESENT   CBC     Status: Abnormal   Collection Time: 12/19/18  1:01 PM  Result Value Ref Range   WBC 10.6 (H) 4.0 - 10.5 K/uL   RBC 4.14 3.87 - 5.11 MIL/uL   Hemoglobin 12.6 12.0 - 15.0 g/dL   HCT 16.1 09.6 - 04.5 %   MCV 91.5 80.0 - 100.0 fL   MCH 30.4 26.0 - 34.0 pg   MCHC 33.2 30.0 - 36.0 g/dL   RDW 40.9 81.1 - 91.4 %   Platelets 214 150 - 400 K/uL   nRBC 0.0 0.0 - 0.2 %  Comprehensive metabolic panel     Status: Abnormal   Collection Time: 12/19/18  1:01 PM  Result Value Ref Range   Sodium 134 (L) 135 - 145 mmol/L   Potassium 3.5 3.5 - 5.1 mmol/L   Chloride 102 98 - 111 mmol/L   CO2 23 22 - 32 mmol/L   Glucose, Bld 311 (H) 70 - 99 mg/dL   BUN 5 (L) 6 - 20 mg/dL   Creatinine, Ser 7.82 0.44 - 1.00 mg/dL   Calcium 8.3 (L) 8.9 - 10.3 mg/dL   Total Protein 6.0 (L) 6.5 - 8.1 g/dL   Albumin 3.2 (L) 3.5 - 5.0 g/dL   AST 21 15 - 41 U/L   ALT 16 0 - 44 U/L   Alkaline Phosphatase 53 38 - 126 U/L   Total Bilirubin 0.6 0.3 - 1.2 mg/dL   GFR calc non Af Amer >60 >60 mL/min   GFR calc Af Amer >60 >60 mL/min   Anion gap 9 5 - 15       IMAGING No results found.  MAU Management/MDM: Orders Placed This Encounter  Procedures  . Urinalysis, Routine w reflex  microscopic  . CBC  . Comprehensive metabolic panel  . Urinalysis, Microscopic (reflex)  . Discharge patient    Meds ordered this encounter  Medications  . dextrose 5% lactated ringers bolus 1,000 mL  . promethazine (PHENERGAN) injection 12.5 mg  . DISCONTD: lactated ringers infusion  . ondansetron (ZOFRAN) injection 4 mg  . famotidine (PEPCID) IVPB 20 mg premix  . lactated ringers 1,000 mL with multivitamins adult (INFUVITE ADULT) 10 mL infusion  . metoCLOPramide (REGLAN) 10 MG tablet    Sig: Take 1 tablet (10 mg total) by mouth 3 (three) times daily before meals.    Dispense:  90 tablet    Refill:  2    Order Specific Question:   Supervising Provider    Answer:   Adam Phenix [[id number]]  . promethazine (PHENERGAN) 25 MG tablet    Sig: Take 0.5-1 tablets (12.5-25 mg total) by mouth at bedtime as needed for nausea.    Dispense:  30 tablet    Refill:  5    Order Specific Question:   Supervising Provider    Answer:   Adam Phenix [[id number]]  . ondansetron (ZOFRAN ODT) 8 MG disintegrating tablet    Sig: Take 1 tablet (8 mg total) by mouth every 8 (eight) hours as needed for nausea or vomiting.    Dispense:  30 tablet    Refill:  5    Order Specific Question:   Supervising Provider    Answer:   Adam Phenix [[id number]]    Electrolytes wnl.  UA with 80 ketones and high specific gravity, indicating dehydration.  D5LR x 1000 ml, LR x 1000 ml, and multivitamins in LR x 1000 ml given for total of 3 bags of IV fluid.  Phenergan 12.5 mg IV, Pepcid 20 mg IV, and Zofran 4 mg IV given. Pt with significant improvement in symptoms.  D/c home with plan to take Reglan TID with meals, Phenergan QHS and Zofran PRN.  Pt to return to MAU as needed for emergencies. Pt discharged with strict return precautions.  ASSESSMENT 1. Hyperemesis complicating pregnancy, antepartum     PLAN Discharge home Allergies as of 12/19/2018   No Known Allergies     Medication List    STOP taking these medications    methylPREDNISolone 4 MG tablet Commonly known as:  MEDROL   methylPREDNISolone 8 MG tablet Commonly known as:  MEDROL     TAKE these medications   metoCLOPramide 10 MG tablet Commonly known as:  REGLAN Take 1 tablet (10 mg total) by mouth 3 (three) times daily before meals.   ondansetron 8 MG disintegrating tablet Commonly known as:  ZOFRAN ODT Take 1 tablet (8 mg total) by mouth every 8 (eight) hours as needed for nausea or vomiting.   ondansetron 8 MG disintegrating tablet Commonly known as:  ZOFRAN ODT Take 1 tablet (8 mg total) by mouth every 8 (eight) hours as needed for nausea or vomiting.   pantoprazole 40 MG tablet Commonly known as:  PROTONIX Take 1 tablet (40 mg total) by mouth 2 (two) times daily.   promethazine 25 MG tablet Commonly known as:  PHENERGAN Take 0.5-1 tablets (12.5-25 mg total) by mouth at bedtime as needed for nausea.      Follow-up Information    [facility] Follow up.   Contact information: [address]  [address] [place] [address] [phone]           Sharen Counter Certified Nurse-Midwife 12/19/2018  10:20 PM

## 2019-01-05 ENCOUNTER — Encounter (HOSPITAL_COMMUNITY): Payer: Self-pay | Admitting: *Deleted

## 2019-01-05 ENCOUNTER — Other Ambulatory Visit: Payer: Self-pay

## 2019-01-05 ENCOUNTER — Inpatient Hospital Stay (HOSPITAL_COMMUNITY)
Admission: AD | Admit: 2019-01-05 | Discharge: 2019-01-08 | DRG: 833 | Disposition: A | Discharge status: Home / Self Care | Payer: Medicaid Other | Attending: Obstetrics and Gynecology | Admitting: Obstetrics and Gynecology

## 2019-01-05 DIAGNOSIS — Z3A20 20 weeks gestation of pregnancy: Secondary | ICD-10-CM

## 2019-01-05 DIAGNOSIS — O210 Mild hyperemesis gravidarum: Secondary | ICD-10-CM | POA: Diagnosis present

## 2019-01-05 DIAGNOSIS — O211 Hyperemesis gravidarum with metabolic disturbance: Secondary | ICD-10-CM | POA: Diagnosis not present

## 2019-01-05 DIAGNOSIS — Z87891 Personal history of nicotine dependence: Secondary | ICD-10-CM | POA: Diagnosis not present

## 2019-01-05 HISTORY — DX: Chlamydial infection, unspecified: A74.9

## 2019-01-05 HISTORY — DX: Depression, unspecified: F32.A

## 2019-01-05 HISTORY — DX: Unspecified ovarian cyst, unspecified side: N83.209

## 2019-01-05 HISTORY — DX: Major depressive disorder, single episode, unspecified: F32.9

## 2019-01-05 HISTORY — DX: Unspecified infectious disease: B99.9

## 2019-01-05 LAB — COMPREHENSIVE METABOLIC PANEL
ALT: 29 U/L (ref 0–44)
AST: 31 U/L (ref 15–41)
Albumin: 3 g/dL — ABNORMAL LOW (ref 3.5–5.0)
Alkaline Phosphatase: 62 U/L (ref 38–126)
Anion gap: 10 (ref 5–15)
BUN: <5 mg/dL — ABNORMAL LOW (ref 6–20)
CHLORIDE: 108 mmol/L (ref 98–111)
CO2: 18 mmol/L — ABNORMAL LOW (ref 22–32)
CREATININE: 0.49 mg/dL (ref 0.44–1.00)
Calcium: 8.7 mg/dL — ABNORMAL LOW (ref 8.9–10.3)
GFR calc Af Amer: >60 mL/min (ref >60)
Glucose, Bld: 118 mg/dL — ABNORMAL HIGH (ref 70–99)
Potassium: 3 mmol/L — ABNORMAL LOW (ref 3.5–5.1)
Sodium: 136 mmol/L (ref 135–145)
Total Bilirubin: 0.9 mg/dL (ref 0.3–1.2)
Total Protein: 5.9 g/dL — ABNORMAL LOW (ref 6.5–8.1)

## 2019-01-05 LAB — URINALYSIS, ROUTINE W REFLEX MICROSCOPIC
Bilirubin Urine: NEGATIVE
GLUCOSE, UA: NEGATIVE mg/dL
Hgb urine dipstick: NEGATIVE
Ketones, ur: 80 mg/dL — AB
NITRITE: NEGATIVE
PROTEIN: 100 mg/dL — AB
Specific Gravity, Urine: 1.017 (ref 1.005–1.030)
Squamous Epithelial / HPF: >50 — ABNORMAL HIGH (ref 0–5)
pH: 7 (ref 5.0–8.0)

## 2019-01-05 LAB — CBC
HCT: 36.2 % (ref 36.0–46.0)
Hemoglobin: 12.1 g/dL (ref 12.0–15.0)
MCH: 29.5 pg (ref 26.0–34.0)
MCHC: 33.4 g/dL (ref 30.0–36.0)
MCV: 88.3 fL (ref 80.0–100.0)
Platelets: 305 10*3/uL (ref 150–400)
RBC: 4.1 MIL/uL (ref 3.87–5.11)
RDW: 12.8 % (ref 11.5–15.5)
WBC: 19.2 10*3/uL — ABNORMAL HIGH (ref 4.0–10.5)
nRBC: 0 % (ref 0.0–0.2)

## 2019-01-05 MED ORDER — PANTOPRAZOLE SODIUM 40 MG IV SOLR
40.0000 mg | INTRAVENOUS | Status: DC
  Administered 2019-01-05: 40 mg via INTRAVENOUS
  Filled 2019-01-05 (×2): qty 40

## 2019-01-05 MED ORDER — SODIUM CHLORIDE 0.9 % IV SOLN
8.0000 mg | Freq: Once | INTRAVENOUS | Status: AC
  Administered 2019-01-05: 8 mg via INTRAVENOUS
  Filled 2019-01-05: qty 4

## 2019-01-05 MED ORDER — METHYLPREDNISOLONE 16 MG PO TABS
16.0000 mg | ORAL_TABLET | Freq: Every day | ORAL | Status: DC
  Administered 2019-01-06 – 2019-01-08 (×3): 16 mg via ORAL
  Filled 2019-01-05 (×3): qty 1

## 2019-01-05 MED ORDER — METHYLPREDNISOLONE 4 MG PO TABS: 4.0000 mg | ORAL_TABLET | Freq: Every day | ORAL | Status: DC

## 2019-01-05 MED ORDER — METHYLPREDNISOLONE SODIUM SUCC 125 MG IJ SOLR
48.0000 mg | Freq: Once | INTRAMUSCULAR | Status: AC
  Administered 2019-01-05: 48 mg via INTRAVENOUS
  Filled 2019-01-05: qty 2

## 2019-01-05 MED ORDER — FAMOTIDINE IN NACL 20-0.9 MG/50ML-% IV SOLN
20.0000 mg | Freq: Once | INTRAVENOUS | Status: AC
  Administered 2019-01-05: 20 mg via INTRAVENOUS
  Filled 2019-01-05: qty 50

## 2019-01-05 MED ORDER — SODIUM CHLORIDE 0.9 % IV SOLN
12.5000 mg | Freq: Three times a day (TID) | INTRAVENOUS | Status: DC | PRN
  Administered 2019-01-05 – 2019-01-06 (×3): 12.5 mg via INTRAVENOUS
  Filled 2019-01-05 (×4): qty 0.5

## 2019-01-05 MED ORDER — LACTATED RINGERS IV BOLUS
1000.0000 mL | Freq: Once | INTRAVENOUS | Status: AC
  Administered 2019-01-05: 1000 mL via INTRAVENOUS

## 2019-01-05 MED ORDER — METHYLPREDNISOLONE 4 MG PO TABS
8.0000 mg | ORAL_TABLET | Freq: Every day | ORAL | Status: DC
  Administered 2019-01-08: 8 mg via ORAL
  Filled 2019-01-05: qty 2

## 2019-01-05 MED ORDER — METHYLPREDNISOLONE 16 MG PO TABS
16.0000 mg | ORAL_TABLET | Freq: Every day | ORAL | Status: AC
  Administered 2019-01-06 – 2019-01-07 (×2): 16 mg via ORAL
  Filled 2019-01-05 (×2): qty 1

## 2019-01-05 MED ORDER — METHYLPREDNISOLONE 4 MG PO TABS: 8.0000 mg | ORAL_TABLET | Freq: Every day | ORAL | Status: DC

## 2019-01-05 MED ORDER — PROMETHAZINE HCL 25 MG RE SUPP
25.0000 mg | Freq: Four times a day (QID) | RECTAL | Status: DC | PRN
  Administered 2019-01-05 – 2019-01-07 (×4): 25 mg via RECTAL
  Filled 2019-01-05 (×5): qty 1

## 2019-01-05 MED ORDER — METHYLPREDNISOLONE 16 MG PO TABS
16.0000 mg | ORAL_TABLET | Freq: Every day | ORAL | Status: DC
  Administered 2019-01-06 – 2019-01-07 (×2): 16 mg via ORAL
  Filled 2019-01-05 (×3): qty 1

## 2019-01-05 MED ORDER — M.V.I. ADULT IV INJ
Freq: Once | INTRAVENOUS | Status: AC
  Administered 2019-01-05: 13:00:00 via INTRAVENOUS
  Filled 2019-01-05 (×2): qty 1000

## 2019-01-05 MED ORDER — KCL-LACTATED RINGERS-D5W 20 MEQ/L IV SOLN
INTRAVENOUS | Status: DC
  Administered 2019-01-05 – 2019-01-08 (×9): via INTRAVENOUS
  Filled 2019-01-05 (×11): qty 1000

## 2019-01-05 MED ORDER — PROMETHAZINE HCL 25 MG/ML IJ SOLN
25.0000 mg | Freq: Once | INTRAVENOUS | Status: AC
  Administered 2019-01-05: 25 mg via INTRAVENOUS
  Filled 2019-01-05: qty 1

## 2019-01-05 MED ORDER — SODIUM CHLORIDE 0.9 % IV SOLN
8.0000 mg | Freq: Three times a day (TID) | INTRAVENOUS | Status: DC
  Administered 2019-01-05 – 2019-01-07 (×6): 8 mg via INTRAVENOUS
  Filled 2019-01-05 (×8): qty 4

## 2019-01-05 NOTE — MAU Note (Signed)
PT asleep 

## 2019-01-05 NOTE — MAU Note (Signed)
Can't stop throwing up, started on Sunday. Denies diarrhea or fever. Denies exp.

## 2019-01-05 NOTE — MAU Provider Note (Signed)
History     CSN: [id number]  Arrival date and time: 01/05/19 8466   First Provider Initiated Contact with Patient 01/05/19 1030      Chief Complaint  Patient presents with  . Emesis   HPI  Ms.  Kara Barron is a 23 y.o. year old G1P0 female at [redacted]w[redacted]d weeks gestation who presents to MAU reporting can't stop throwing up since Sunday 01/03/2019. She has had N/V her entire pregnancy. She normally takes Reglan, Zofran and Phenergan for N/V; last dose 01/04/2019. She did not take any of her medications today, "because I didn't know what y'all were going to give me. The last time she took any of those medications was on 01/04/2019. She last tried to eat Goldfish @ 2000 on 01/04/2019, but vomited that up. She last tried to drink water this morning @ 0700, but she threw it up. She denies diarrhea, fever or any recent exposure.  Past Medical History:  Diagnosis Date  . Chlamydia   . Depression    not taking any meds currently  . Infection    UTI  . Medical history non-contributory   . Ovarian cyst     Past Surgical History:  Procedure Laterality Date  . TONSILLECTOMY      Family History  Problem Relation Age of Onset  . Hypertension Mother   . Healthy Father     Social History   Tobacco Use  . Smoking status: Former Smoker    Types: Cigarettes  . Smokeless tobacco: Never Used  . Tobacco comment: 2018  Substance Use Topics  . Alcohol use: Not Currently  . Drug use: Never    Allergies: No Known Allergies  Medications Prior to Admission  Medication Sig Dispense Refill Last Dose  . metoCLOPramide (REGLAN) 10 MG tablet Take 1 tablet (10 mg total) by mouth 3 (three) times daily before meals. 90 tablet 2   . ondansetron (ZOFRAN ODT) 8 MG disintegrating tablet Take 1 tablet (8 mg total) by mouth every 8 (eight) hours as needed for nausea or vomiting. (Patient not taking: Reported on 11/30/2018) 30 tablet 0 Not Taking at Unknown time  . ondansetron (ZOFRAN ODT) 8 MG disintegrating  tablet Take 1 tablet (8 mg total) by mouth every 8 (eight) hours as needed for nausea or vomiting. 30 tablet 5   . pantoprazole (PROTONIX) 40 MG tablet Take 1 tablet (40 mg total) by mouth 2 (two) times daily. 30 tablet 4   . promethazine (PHENERGAN) 25 MG tablet Take 0.5-1 tablets (12.5-25 mg total) by mouth at bedtime as needed for nausea. 30 tablet 5     Review of Systems  Constitutional: Negative.   HENT: Negative.   Eyes: Negative.   Respiratory: Negative.   Cardiovascular: Negative.   Gastrointestinal: Positive for abdominal pain, nausea and vomiting.  Endocrine: Negative.   Genitourinary: Negative.   Musculoskeletal: Negative.   Skin: Negative.   Allergic/Immunologic: Negative.   Neurological: Negative.   Hematological: Negative.   Psychiatric/Behavioral: Negative.    Physical Exam   Blood pressure 126/80, pulse 92, temperature 98.3 F (36.8 C), temperature source Oral, resp. rate 20, weight 59 kg, last menstrual period 08/18/2018, SpO2 97 %.  Physical Exam  Vitals reviewed. Constitutional: She is oriented to person, place, and time. She appears well-developed and well-nourished.  HENT:  Head: Normocephalic and atraumatic.  Neck: Normal range of motion.  Cardiovascular: Normal rate.  Respiratory: Effort normal.  GI: Soft.  Pt actively gagging/vomiting in a bucket at the bedside  Genitourinary:  Genitourinary Comments: Pelvic deferred   Musculoskeletal: Normal range of motion.  Neurological: She is alert and oriented to person, place, and time.  Skin: Skin is warm and dry.  Psychiatric: She has a normal mood and affect. Her behavior is normal. Judgment and thought content normal.    MAU Course  Procedures  MDM CCUA IVFs: Phenergan 25 mg in LR 1000 ml @ 999 ml/hr; followed by MVI in LR 1000 ml @ 500 ml/hr -- still having nausea/vomiting PO Challenge -- patient did not tolerate  Zofran 8 mg IVPB -- still nauseated Pepcid 20 mg IVPB -- still nauseated LR 1000  ml bolus  *Consult with Dr. Jolayne Panther @ [phone] - notified of patient's complaints, assessments, lab results & inability to tolerate PO fluids, recommended tx plan admit to hospital  *Consult with Dr. Ellyn Hack @ [phone] - notified of patient's complaints, assessments, lab results, inability to tolerate po fluids, & recommended tx plan of admission by Dr. Jolayne Panther -- "will need to call back" d/t needing to check another patient // Dr. Ellyn Hack in to see patient at 1700  Results for orders placed or performed during the hospital encounter of 01/05/19 (from the past 24 hour(s))  Urinalysis, Routine w reflex microscopic     Status: Abnormal   Collection Time: 01/05/19 10:10 AM  Result Value Ref Range   Color, Urine AMBER (A) YELLOW   APPearance CLOUDY (A) CLEAR   Specific Gravity, Urine 1.017 1.005 - 1.030   pH 7.0 5.0 - 8.0   Glucose, UA NEGATIVE NEGATIVE mg/dL   Hgb urine dipstick NEGATIVE NEGATIVE   Bilirubin Urine NEGATIVE NEGATIVE   Ketones, ur 80 (A) NEGATIVE mg/dL   Protein, ur 846 (A) NEGATIVE mg/dL   Nitrite NEGATIVE NEGATIVE   Leukocytes,Ua SMALL (A) NEGATIVE   RBC / HPF 0-5 0 - 5 RBC/hpf   WBC, UA 11-20 0 - 5 WBC/hpf   Bacteria, UA FEW (A) NONE SEEN   Squamous Epithelial / LPF >50 (H) 0 - 5   Mucus PRESENT     Assessment and Plan  Hyperemesis complicating pregnancy, antepartum - Plan: Discharge patient - Admit to [facility] - Dr. Ellyn Hack assumes care at 1700 -- see H&P documentation   Raelyn Mora, MSN, CNM 01/05/2019, 10:31 AM

## 2019-01-05 NOTE — H&P (Signed)
Kimothy Croke is a 23 y.o. female G1P0 at 45wk with hyperemesis.  Unable to tolerate po despite fluids and meds.  Will try thorazine and steroid taper.  Relatively uncomplicated PNC except previous admission for hyperemesis.  Pregnancy dated by LMP cw First trimester [place].    OB History    Gravida  1   Para      Term      Preterm      AB      Living        SAB      TAB      Ectopic      Multiple      Live Births            G1 presnet  Limited anatomy, scheduled for f/u No abn pap H/o Chl  Past Medical History:  Diagnosis Date  . Chlamydia   . Depression    not taking any meds currently  . Infection    UTI  . Medical history non-contributory   . Ovarian cyst    Past Surgical History:  Procedure Laterality Date  . TONSILLECTOMY     Family History: family history includes Healthy in her father; Hypertension in her mother. Colon cancer Social History:  reports that she has quit smoking. Her smoking use included cigarettes. She has never used smokeless tobacco. She reports previous alcohol use. She reports that she does not use drugs. single, student  Meds Zofran, protonix, PNV, Phenergan All NKDA     Maternal Diabetes: No Genetic Screening: Normal Maternal Ultrasounds/Referrals: Normal Fetal Ultrasounds or other Referrals:  None Maternal Substance Abuse:  No Significant Maternal Medications:  None Significant Maternal Lab Results:  None Other Comments:  None  Review of Systems  Constitutional: Negative.   HENT: Negative.   Eyes: Negative.   Respiratory: Negative.   Cardiovascular: Negative.   Gastrointestinal: Positive for abdominal pain, heartburn, nausea and vomiting.  Genitourinary: Negative.   Musculoskeletal: Negative.   Skin: Negative.   Neurological: Negative.   Psychiatric/Behavioral: Negative.    Maternal Medical History:  Reason for admission: Nausea.   Contractions: Frequency: irregular.    Fetal activity: Perceived fetal  activity is normal.    Prenatal complications: hyperemesis  Prenatal Complications - Diabetes: none.      Blood pressure 122/78, pulse 97, temperature 98.7 F (37.1 C), resp. rate 18, weight 59 kg, last menstrual period 08/18/2018, SpO2 97 %. Maternal Exam:  Abdomen: Patient reports no abdominal tenderness. Fundal height is appropriate for gestation.    Introitus: Normal vulva. Normal vagina.    Physical Exam  Constitutional: She is oriented to person, place, and time. She appears well-developed and well-nourished.  tearful  HENT:  Head: Normocephalic and atraumatic.  Cardiovascular: Normal rate and regular rhythm.  Respiratory: Effort normal and breath sounds normal. No respiratory distress. She has no wheezes.  GI: Soft. Bowel sounds are normal. She exhibits no distension. There is no abdominal tenderness.  Genitourinary:    Vulva normal.   Musculoskeletal: Normal range of motion.  Neurological: She is alert and oriented to person, place, and time.  Skin: Skin is warm and dry.  Psychiatric: She has a normal mood and affect. Her behavior is normal.    Prenatal labs: ABO, Rh:  A+ Antibody:  neg Rubella:  immune RPR:   NR HBsAg:   neg HIV:   neg GBS:   unknown  Hgb 13.9/Plt 279/Ur Cx neg/GC neg/Chl neg/Hgb electro electro WNL/Pap WNL/First tri [place] - nl 1st tri  screen; Anat [place] - nl limited anat, incomplete anat, female   Assessment/Plan: 23yo G1P0 at 20wk with hyperemesis CBC, CMP IVF - D5 LR with K Thorazine, Zofran, steroid taper, prn phenergan    Luvenia Cranford Bovard-Stuckert 01/05/2019, 5:23 PM

## 2019-01-05 NOTE — Progress Notes (Signed)
Pharmacy Consult:   MEDROL (METHYLPREDNISOLONE) TAPER  FOR HYPEREMESIS GRAVIDARUM PATIENTS  The following is a 14 day taper of methylprednisolone for hyperemesis. Doses on day 1  will be given IV.  All doses starting on day 2  will be given PO. (If patient cannot tolerate oral medications, contact the pharmacy to change route to IV.)   Date Day Morning Midday Bedtime  3/10 1   48mg   3/11 2 16  mg 16 mg 16 mg  3/12 3 16  mg 16 mg 16 mg  3/13 4 16  mg 8 mg 16 mg  3/14 5 16  mg 8 mg 8 mg  3/15 6 8  mg 8 mg 8 mg  3/16 7 8  mg 4 mg 8 mg  3/17 8 8  mg 4 mg 4 mg  3/18 9 8  mg 4 mg   3/19 10 8  mg 4 mg   3/20 11 8  mg    3/21 12 8  mg    3/22 13 4  mg    3/23 14 4  mg     Check fasting blood sugars daily while on the taper. Notify MD if fasting blood sugar>95.  Claybon Jabs 01/05/2019

## 2019-01-05 NOTE — Discharge Instructions (Signed)
Hyperemesis Gravidarum  Hyperemesis gravidarum is a severe form of nausea and vomiting that happens during pregnancy. Hyperemesis is worse than morning sickness. It may cause you to have nausea or vomiting all day for many days. It may keep you from eating and drinking enough food and liquids, which can lead to dehydration, malnutrition, and weight loss. Hyperemesis usually occurs during the first half (the first 20 weeks) of pregnancy. It often goes away once a woman is in her second half of pregnancy. However, sometimes hyperemesis continues through an entire pregnancy.  What are the causes?  The cause of this condition is not known. It may be related to changes in chemicals (hormones) in the body during pregnancy, such as the high level of pregnancy hormone (human chorionic gonadotropin) or the increase in the female sex hormone (estrogen).  What are the signs or symptoms?  Symptoms of this condition include:  Nausea that does not go away.  Vomiting that does not allow you to keep any food down.  Weight loss.  Body fluid loss (dehydration).  Having no desire to eat, or not liking food that you have previously enjoyed.  How is this diagnosed?  This condition may be diagnosed based on:  A physical exam.  Your medical history.  Your symptoms.  Blood tests.  Urine tests.  How is this treated?  This condition is managed by controlling symptoms. This may include:  Following an eating plan. This can help lessen nausea and vomiting.  Taking prescription medicines.  An eating plan and medicines are often used together to help control symptoms. If medicines do not help relieve nausea and vomiting, you may need to receive fluids through an IV at the hospital.  Follow these instructions at home:  Eating and drinking    Avoid the following:  Drinking fluids with meals. Try not to drink anything during the 30 minutes before and after your meals.  Drinking more than 1 cup of fluid at a time.  Eating foods that trigger your  symptoms. These may include spicy foods, coffee, high-fat foods, very sweet foods, and acidic foods.  Skipping meals. Nausea can be more intense on an empty stomach. If you cannot tolerate food, do not force it. Try sucking on ice chips or other frozen items and make up for missed calories later.  Lying down within 2 hours after eating.  Being exposed to environmental triggers. These may include food smells, smoky rooms, closed spaces, rooms with strong smells, warm or humid places, overly loud and noisy rooms, and rooms with motion or flickering lights. Try eating meals in a well-ventilated area that is free of strong smells.  Quick and sudden changes in your movement.  Taking iron pills and multivitamins that contain iron. If you take prescription iron pills, do not stop taking them unless your health care provider approves.  Preparing food. The smell of food can spoil your appetite or trigger nausea.  To help relieve your symptoms:  Listen to your body. Everyone is different and has different preferences. Find what works best for you.  Eat and drink slowly.  Eat 5-6 small meals daily instead of 3 large meals. Eating small meals and snacks can help you avoid an empty stomach.  In the morning, before getting out of bed, eat a couple of crackers to avoid moving around on an empty stomach.  Try eating starchy foods as these are usually tolerated well. Examples include cereal, toast, bread, potatoes, pasta, rice, and pretzels.  Include at   least 1 serving of protein with your meals and snacks. Protein options include lean meats, poultry, seafood, beans, nuts, nut butters, eggs, cheese, and yogurt.  Try eating a protein-rich snack before bed. Examples of a protein-rick snack include cheese and crackers or a peanut butter sandwich made with 1 slice of whole-wheat bread and 1 tsp (5 g) of peanut butter.  Eat or suck on things that have ginger in them. It may help relieve nausea. Add  tsp ground ginger to hot tea or  choose ginger tea.  Try drinking 100% fruit juice or an electrolyte drink. An electrolyte drink contains sodium, potassium, and chloride.  Drink fluids that are cold, clear, and carbonated or sour. Examples include lemonade, ginger ale, lemon-lime soda, ice water, and sparkling water.  Brush your teeth or use a mouth rinse after meals.  Talk with your health care provider about starting a supplement of vitamin B6.  General instructions  Take over-the-counter and prescription medicines only as told by your health care provider.  Follow instructions from your health care provider about eating or drinking restrictions.  Continue to take your prenatal vitamins as told by your health care provider. If you are having trouble taking your prenatal vitamins, talk with your health care provider about different options.  Keep all follow-up and pre-birth (prenatal) visits as told by your health care provider. This is important.  Contact a health care provider if:  You have pain in your abdomen.  You have a severe headache.  You have vision problems.  You are losing weight.  You feel weak or dizzy.  Get help right away if:  You cannot drink fluids without vomiting.  You vomit blood.  You have constant nausea and vomiting.  You are very weak.  You faint.  You have a fever and your symptoms suddenly get worse.  Summary  Hyperemesis gravidarum is a severe form of nausea and vomiting that happens during pregnancy.  Making some changes to your eating habits may help relieve nausea and vomiting.  This condition may be managed with medicine.  If medicines do not help relieve nausea and vomiting, you may need to receive fluids through an IV at the hospital.  This information is not intended to replace advice given to you by your health care provider. Make sure you discuss any questions you have with your health care provider.  Document Released: 10/14/2005 Document Revised: 11/03/2017 Document Reviewed: 06/12/2016  Elsevier Interactive  Patient Education  2019 [employer]

## 2019-01-06 LAB — GLUCOSE, CAPILLARY: Glucose-Capillary: 136 mg/dL — ABNORMAL HIGH (ref 70–99)

## 2019-01-06 LAB — GLUCOSE, RANDOM: Glucose, Bld: 148 mg/dL — ABNORMAL HIGH (ref 70–99)

## 2019-01-06 MED ORDER — PANTOPRAZOLE SODIUM 40 MG IV SOLR
20.0000 mg | Freq: Three times a day (TID) | INTRAVENOUS | Status: DC
  Administered 2019-01-06 – 2019-01-07 (×4): 20 mg via INTRAVENOUS
  Filled 2019-01-06 (×4): qty 40

## 2019-01-06 NOTE — Progress Notes (Signed)
Patient ID: Kara Barron, female   DOB: 1996/06/02, 23 y.o.   MRN: [id number] Pt reports no vomiting since this am. Feels well and hungry Reflux stable VSS EFM - 145 via doppler SVE - deferred  A/P : Pt tolerated ice chips; will allow to try crackers tonight ; consider advance to soft diet in am if tolerates          Continue current mgmt

## 2019-01-06 NOTE — Progress Notes (Signed)
Patient ID: Kara Barron, female   DOB: [birth year], 23 y.o.   MRN: [id number] Pt reports had a "rough night and morning" due to nausea but improved now. Has not vomited in past 2-3hours since taking med by mouth. She is appreciating FMs. Requests ice chips  VSS  EFM - 150s via doppler an hour ago   Fasting glucose 148  A/P: Prime at 20 1/7wks with hyperemesis, hypokalemia and hyperglycemia - likely due to steroids         Will trial ice chips to see if pt tolerates         Continue IVF - D5 LR with K, Thorazine, Zofran, steroid taper, prn phenergan          Advance diet gradually as tolerates

## 2019-01-07 LAB — GLUCOSE, CAPILLARY: GLUCOSE-CAPILLARY: 112 mg/dL — AB (ref 70–99)

## 2019-01-07 MED ORDER — PROMETHAZINE HCL 25 MG/ML IJ SOLN: 12.5000 mg | Freq: Four times a day (QID) | INTRAMUSCULAR | Status: DC | PRN

## 2019-01-07 MED ORDER — CHLORPROMAZINE HCL 10 MG PO TABS
10.0000 mg | ORAL_TABLET | Freq: Three times a day (TID) | ORAL | Status: DC
  Administered 2019-01-07 – 2019-01-08 (×2): 10 mg via ORAL
  Filled 2019-01-07 (×4): qty 1

## 2019-01-07 MED ORDER — PANTOPRAZOLE SODIUM 40 MG PO TBEC
40.0000 mg | DELAYED_RELEASE_TABLET | Freq: Two times a day (BID) | ORAL | Status: DC
  Administered 2019-01-07 – 2019-01-08 (×2): 40 mg via ORAL
  Filled 2019-01-07 (×2): qty 1

## 2019-01-07 MED ORDER — ONDANSETRON 4 MG PO TBDP
8.0000 mg | ORAL_TABLET | Freq: Three times a day (TID) | ORAL | Status: DC | PRN
  Administered 2019-01-08: 8 mg via ORAL
  Filled 2019-01-07: qty 2

## 2019-01-07 NOTE — Progress Notes (Signed)
Patient ID: Kara Barron, female   DOB: Jan 17, 1996, 23 y.o.   MRN: [id number] HD #3 hyperemesis  Pt able to tolerate sips/chips yesterday but had an episode of emesis early this AM.  Not currently vomiting  afeb VSS  Gravid NT  Pt on scheduled Zofran, thorazine, and steroid taper Phenergan supp prn did not stay in so will change to IV Advised pt to do sips and chips again today and see how it goes.

## 2019-01-07 NOTE — Progress Notes (Signed)
Nurse called in room due to pts nose bleeding. Nose bleed was light and only lasted 5 min. Will continue to monitor

## 2019-01-07 NOTE — Progress Notes (Signed)
Dr Senaida Ores notified of CBG 112. Rn to give solumedrol taper. Pt may try to eat.

## 2019-01-07 NOTE — Progress Notes (Signed)
Patient ID: Kara Barron, female   DOB: 04-13-1996, 23 y.o.   MRN: [id number] Feeling better, no emesis all day and drinking clears Feels like eating mashed potatoes  afeb vss NT  Will try to transition to po meds and allow po bland diet this PM

## 2019-01-08 LAB — GLUCOSE, CAPILLARY: Glucose-Capillary: 104 mg/dL — ABNORMAL HIGH (ref 70–99)

## 2019-01-08 LAB — GLUCOSE, RANDOM: Glucose, Bld: 117 mg/dL — ABNORMAL HIGH (ref 70–99)

## 2019-01-08 MED ORDER — METHYLPREDNISOLONE 4 MG PO TABS: 4.0000 mg | ORAL_TABLET | Freq: Every day | ORAL | 0 refills | Status: AC

## 2019-01-08 MED ORDER — METHYLPREDNISOLONE 8 MG PO TABS: 8.0000 mg | ORAL_TABLET | Freq: Every day | ORAL | 0 refills | Status: AC

## 2019-01-08 MED ORDER — CHLORPROMAZINE HCL 10 MG PO TABS: 10.0000 mg | ORAL_TABLET | Freq: Three times a day (TID) | ORAL | 1 refills | Status: DC | PRN

## 2019-01-08 NOTE — Progress Notes (Signed)
Discharge instructions given to patient by student RN per this RN. Discussed medication changes including solumedrol taper, follow up appointments and signs and symptoms of hyperemesis and when to notify provider.  Patient  Verbalized understanding.

## 2019-01-08 NOTE — Progress Notes (Signed)
HD #4, [redacted]W[redacted]D, hyperemesis Feeling better so far this morning, no emesis overnight Afeb, VSS Fundus NT Will try PO today and see if ok to d/c home this pm

## 2019-01-08 NOTE — Progress Notes (Signed)
Dr. Senaida Ores notified of fasting CBG of 104  Kaiea Esselman E Gorje Iyer, RN

## 2019-01-25 NOTE — Discharge Summary (Signed)
Physician Discharge Summary  Patient ID: Kara Barron MRN: [id number] DOB/AGE: 22-13-1997 22 y.o.  Admit date: 01/05/2019 Discharge date: 01/08/2019  Admission Diagnoses:  IUP at 20 weeks, hyperemesis gravidarum  Discharge Diagnoses: Same Active Problems:   Hyperemesis complicating pregnancy, antepartum   Discharged Condition: good  Hospital Course: Pt admitted with increased n/v, started on Medrol taper, Zofran and Thorazine.  Over 3 days, she was eventually able to tolerate PO and was stable for discharge  Discharge Exam: Blood pressure 117/77, pulse 87, temperature 98.4 F (36.9 C), temperature source Oral, resp. rate 18, height 5\' 3"  (1.6 m), weight 59 kg, last menstrual period 08/18/2018, SpO2 98 %. General appearance: alert  Disposition: Discharge disposition: 01-Home or Self Care       Discharge Instructions    Discharge activity:  No Restrictions   Complete by:  As directed    Discharge diet:  No restrictions   Complete by:  As directed    Discharge patient   Complete by:  As directed    Discharge disposition:  01-Home or Self Care   Discharge patient date:  01/05/2019   No sexual activity restrictions   Complete by:  As directed      Allergies as of 01/08/2019   No Known Allergies     Medication List    STOP taking these medications   promethazine 25 MG tablet Commonly known as:  PHENERGAN     TAKE these medications   acetaminophen 325 MG tablet Commonly known as:  TYLENOL Take 650 mg by mouth every 6 (six) hours as needed for headache.   chlorproMAZINE 10 MG tablet Commonly known as:  THORAZINE Take 1 tablet (10 mg total) by mouth 3 (three) times daily as needed.   metoCLOPramide 10 MG tablet Commonly known as:  Reglan Take 1 tablet (10 mg total) by mouth 3 (three) times daily before meals.   ondansetron 8 MG disintegrating tablet Commonly known as:  Zofran ODT Take 1 tablet (8 mg total) by mouth every 8 (eight) hours as needed for nausea  or vomiting.   pantoprazole 40 MG tablet Commonly known as:  PROTONIX Take 1 tablet (40 mg total) by mouth 2 (two) times daily.     ASK your doctor about these medications   methylPREDNISolone 8 MG tablet Commonly known as:  MEDROL Take 1 tablet (8 mg total) by mouth daily at 2 PM for 2 days. Ask about: Should I take this medication?   methylPREDNISolone 4 MG tablet Commonly known as:  MEDROL Take 1 tablet (4 mg total) by mouth daily at 2 PM for 4 days. Ask about: Should I take this medication?      Follow-up Information    [facility] Go on 01/19/2019.   Why:  As scheduled Contact information: [address]  [address] [place] [address] [phone]           Signed: Leighton Roach Gerald Kuehl 01/25/2019, 8:55 AM

## 2019-04-27 LAB — OB RESULTS CONSOLE GBS: GBS: NEGATIVE

## 2019-05-11 ENCOUNTER — Telehealth (HOSPITAL_COMMUNITY): Payer: Self-pay | Admitting: *Deleted

## 2019-05-11 NOTE — Telephone Encounter (Signed)
Preadmission screen  

## 2019-05-12 ENCOUNTER — Encounter (HOSPITAL_COMMUNITY): Payer: Self-pay | Admitting: *Deleted

## 2019-05-12 ENCOUNTER — Inpatient Hospital Stay (HOSPITAL_COMMUNITY): Payer: Medicaid Other | Admitting: Anesthesiology

## 2019-05-12 ENCOUNTER — Inpatient Hospital Stay (HOSPITAL_COMMUNITY)
Admission: AD | Admit: 2019-05-12 | Discharge: 2019-05-14 | DRG: 807 | Disposition: A | Discharge status: Home / Self Care | Payer: Medicaid Other | Attending: Obstetrics and Gynecology | Admitting: Obstetrics and Gynecology

## 2019-05-12 ENCOUNTER — Other Ambulatory Visit: Payer: Self-pay

## 2019-05-12 DIAGNOSIS — O9902 Anemia complicating childbirth: Principal | ICD-10-CM | POA: Diagnosis present

## 2019-05-12 DIAGNOSIS — O212 Late vomiting of pregnancy: Secondary | ICD-10-CM | POA: Diagnosis present

## 2019-05-12 DIAGNOSIS — Z3A38 38 weeks gestation of pregnancy: Secondary | ICD-10-CM

## 2019-05-12 DIAGNOSIS — D649 Anemia, unspecified: Secondary | ICD-10-CM | POA: Diagnosis present

## 2019-05-12 DIAGNOSIS — Z1159 Encounter for screening for other viral diseases: Secondary | ICD-10-CM

## 2019-05-12 DIAGNOSIS — Z87891 Personal history of nicotine dependence: Secondary | ICD-10-CM

## 2019-05-12 LAB — TYPE AND SCREEN
ABO/RH(D): A POS
Antibody Screen: NEGATIVE

## 2019-05-12 LAB — CBC
HCT: 34.8 % — ABNORMAL LOW (ref 36.0–46.0)
Hemoglobin: 11.2 g/dL — ABNORMAL LOW (ref 12.0–15.0)
MCH: 26.7 pg (ref 26.0–34.0)
MCHC: 32.2 g/dL (ref 30.0–36.0)
MCV: 82.9 fL (ref 80.0–100.0)
Platelets: 342 10*3/uL (ref 150–400)
RBC: 4.2 MIL/uL (ref 3.87–5.11)
RDW: 14.1 % (ref 11.5–15.5)
WBC: 9.8 10*3/uL (ref 4.0–10.5)
nRBC: 0 % (ref 0.0–0.2)

## 2019-05-12 LAB — ABO/RH: ABO/RH(D): A POS

## 2019-05-12 LAB — SARS CORONAVIRUS 2 BY RT PCR (HOSPITAL ORDER, PERFORMED IN ~~LOC~~ HOSPITAL LAB): SARS Coronavirus 2: NEGATIVE

## 2019-05-12 MED ORDER — LIDOCAINE HCL (PF) 1 % IJ SOLN
INTRAMUSCULAR | Status: DC | PRN
  Administered 2019-05-12: 5 mL via EPIDURAL

## 2019-05-12 MED ORDER — OXYCODONE-ACETAMINOPHEN 5-325 MG PO TABS: 2.0000 | ORAL_TABLET | ORAL | Status: DC | PRN

## 2019-05-12 MED ORDER — ONDANSETRON 4 MG PO TBDP
4.0000 mg | ORAL_TABLET | Freq: Once | ORAL | Status: AC
  Administered 2019-05-12: 4 mg via ORAL
  Filled 2019-05-12: qty 1

## 2019-05-12 MED ORDER — LACTATED RINGERS IV SOLN
INTRAVENOUS | Status: DC
  Administered 2019-05-12 (×2): via INTRAVENOUS

## 2019-05-12 MED ORDER — LACTATED RINGERS IV SOLN: 500.0000 mL | Freq: Once | INTRAVENOUS | Status: DC

## 2019-05-12 MED ORDER — OXYTOCIN 40 UNITS IN NORMAL SALINE INFUSION - SIMPLE MED: 2.5000 [IU]/h | INTRAVENOUS | Status: DC

## 2019-05-12 MED ORDER — EPHEDRINE 5 MG/ML INJ: 10.0000 mg | INTRAVENOUS | Status: DC | PRN

## 2019-05-12 MED ORDER — TETANUS-DIPHTH-ACELL PERTUSSIS 5-2.5-18.5 LF-MCG/0.5 IM SUSP: 0.5000 mL | Freq: Once | INTRAMUSCULAR | Status: DC

## 2019-05-12 MED ORDER — CALCIUM CARBONATE ANTACID 500 MG PO CHEW
2.0000 | CHEWABLE_TABLET | Freq: Every day | ORAL | Status: DC | PRN
  Administered 2019-05-12: 400 mg via ORAL
  Filled 2019-05-12: qty 2

## 2019-05-12 MED ORDER — COCONUT OIL OIL: 1.0000 "application " | TOPICAL_OIL | Status: DC | PRN

## 2019-05-12 MED ORDER — OXYCODONE-ACETAMINOPHEN 5-325 MG PO TABS: 1.0000 | ORAL_TABLET | ORAL | Status: DC | PRN

## 2019-05-12 MED ORDER — DIBUCAINE (PERIANAL) 1 % EX OINT: 1.0000 "application " | TOPICAL_OINTMENT | CUTANEOUS | Status: DC | PRN

## 2019-05-12 MED ORDER — ONDANSETRON HCL 4 MG/2ML IJ SOLN: 4.0000 mg | Freq: Four times a day (QID) | INTRAMUSCULAR | Status: DC | PRN

## 2019-05-12 MED ORDER — SENNOSIDES-DOCUSATE SODIUM 8.6-50 MG PO TABS
2.0000 | ORAL_TABLET | ORAL | Status: DC
  Administered 2019-05-13: 2 via ORAL
  Filled 2019-05-12: qty 2

## 2019-05-12 MED ORDER — WITCH HAZEL-GLYCERIN EX PADS: 1.0000 "application " | MEDICATED_PAD | CUTANEOUS | Status: DC | PRN

## 2019-05-12 MED ORDER — SODIUM CHLORIDE (PF) 0.9 % IJ SOLN
INTRAMUSCULAR | Status: DC | PRN
  Administered 2019-05-12: 12 mL/h via EPIDURAL

## 2019-05-12 MED ORDER — TERBUTALINE SULFATE 1 MG/ML IJ SOLN: 0.2500 mg | Freq: Once | INTRAMUSCULAR | Status: DC | PRN

## 2019-05-12 MED ORDER — OXYCODONE HCL 5 MG PO TABS: 10.0000 mg | ORAL_TABLET | ORAL | Status: DC | PRN

## 2019-05-12 MED ORDER — OXYCODONE HCL 5 MG PO TABS: 5.0000 mg | ORAL_TABLET | ORAL | Status: DC | PRN

## 2019-05-12 MED ORDER — FENTANYL-BUPIVACAINE-NACL 0.5-0.125-0.9 MG/250ML-% EP SOLN
12.0000 mL/h | EPIDURAL | Status: DC | PRN
  Filled 2019-05-12: qty 250

## 2019-05-12 MED ORDER — OXYTOCIN 40 UNITS IN NORMAL SALINE INFUSION - SIMPLE MED
1.0000 m[IU]/min | INTRAVENOUS | Status: DC
  Administered 2019-05-12: 2 m[IU]/min via INTRAVENOUS
  Filled 2019-05-12: qty 1000

## 2019-05-12 MED ORDER — PANTOPRAZOLE SODIUM 40 MG PO TBEC
40.0000 mg | DELAYED_RELEASE_TABLET | Freq: Two times a day (BID) | ORAL | Status: DC
  Administered 2019-05-12 – 2019-05-14 (×4): 40 mg via ORAL
  Filled 2019-05-12 (×4): qty 1

## 2019-05-12 MED ORDER — ONDANSETRON 4 MG PO TBDP: 8.0000 mg | ORAL_TABLET | Freq: Three times a day (TID) | ORAL | Status: DC | PRN

## 2019-05-12 MED ORDER — ONDANSETRON HCL 4 MG PO TABS: 4.0000 mg | ORAL_TABLET | ORAL | Status: DC | PRN

## 2019-05-12 MED ORDER — CHLORPROMAZINE HCL 10 MG PO TABS
10.0000 mg | ORAL_TABLET | Freq: Three times a day (TID) | ORAL | Status: DC | PRN
  Filled 2019-05-12: qty 1

## 2019-05-12 MED ORDER — PHENYLEPHRINE 40 MCG/ML (10ML) SYRINGE FOR IV PUSH (FOR BLOOD PRESSURE SUPPORT): 80.0000 ug | PREFILLED_SYRINGE | INTRAVENOUS | Status: DC | PRN

## 2019-05-12 MED ORDER — ACETAMINOPHEN 325 MG PO TABS: 650.0000 mg | ORAL_TABLET | ORAL | Status: DC | PRN

## 2019-05-12 MED ORDER — DIPHENHYDRAMINE HCL 50 MG/ML IJ SOLN: 12.5000 mg | INTRAMUSCULAR | Status: DC | PRN

## 2019-05-12 MED ORDER — BENZOCAINE-MENTHOL 20-0.5 % EX AERO
1.0000 "application " | INHALATION_SPRAY | CUTANEOUS | Status: DC | PRN
  Administered 2019-05-13: 1 via TOPICAL
  Filled 2019-05-12: qty 56

## 2019-05-12 MED ORDER — PRENATAL MULTIVITAMIN CH
1.0000 | ORAL_TABLET | Freq: Every day | ORAL | Status: DC
  Administered 2019-05-13: 1 via ORAL
  Filled 2019-05-12: qty 1

## 2019-05-12 MED ORDER — SIMETHICONE 80 MG PO CHEW: 80.0000 mg | CHEWABLE_TABLET | ORAL | Status: DC | PRN

## 2019-05-12 MED ORDER — LACTATED RINGERS IV SOLN: 500.0000 mL | INTRAVENOUS | Status: DC | PRN

## 2019-05-12 MED ORDER — PROMETHAZINE HCL 25 MG/ML IJ SOLN
25.0000 mg | Freq: Once | INTRAMUSCULAR | Status: AC
  Administered 2019-05-12: 25 mg via INTRAVENOUS
  Filled 2019-05-12: qty 1

## 2019-05-12 MED ORDER — ZOLPIDEM TARTRATE 5 MG PO TABS: 5.0000 mg | ORAL_TABLET | Freq: Every evening | ORAL | Status: DC | PRN

## 2019-05-12 MED ORDER — OXYTOCIN BOLUS FROM INFUSION
500.0000 mL | Freq: Once | INTRAVENOUS | Status: AC
  Administered 2019-05-12: 500 mL via INTRAVENOUS

## 2019-05-12 MED ORDER — DIPHENHYDRAMINE HCL 25 MG PO CAPS: 25.0000 mg | ORAL_CAPSULE | Freq: Four times a day (QID) | ORAL | Status: DC | PRN

## 2019-05-12 MED ORDER — LIDOCAINE HCL (PF) 1 % IJ SOLN
30.0000 mL | INTRAMUSCULAR | Status: DC | PRN
  Administered 2019-05-12: 30 mL via SUBCUTANEOUS
  Filled 2019-05-12: qty 30

## 2019-05-12 MED ORDER — SOD CITRATE-CITRIC ACID 500-334 MG/5ML PO SOLN: 30.0000 mL | ORAL | Status: DC | PRN

## 2019-05-12 MED ORDER — IBUPROFEN 600 MG PO TABS
600.0000 mg | ORAL_TABLET | Freq: Four times a day (QID) | ORAL | Status: DC
  Administered 2019-05-12 – 2019-05-14 (×6): 600 mg via ORAL
  Filled 2019-05-12 (×5): qty 1

## 2019-05-12 MED ORDER — ONDANSETRON HCL 4 MG/2ML IJ SOLN
4.0000 mg | INTRAMUSCULAR | Status: DC | PRN
  Administered 2019-05-12: 4 mg via INTRAVENOUS
  Filled 2019-05-12: qty 2

## 2019-05-12 NOTE — Progress Notes (Signed)
Patient ID: Kara Barron, female   DOB: Sep 23, 1996, 23 y.o.   MRN: [id number]  Comf with epidural  AFVSS gen NAD FHTs 120's, mod var, + accels, category 1 toco Q 2-3 min  SVE 5/90/0 AROM for clear fluid w/o diff/comp  Continue augmentation of labor Expect SVD

## 2019-05-12 NOTE — H&P (Signed)
Kara Barron is a 23 y.o. female G1P0 at 38+ with N/V and cervical change.  Pregnancy complicated by hyperemesis - admitted x 2, treated w thorazine.  Dated by LMP - EDC 7/28.  Pregnancy also complicated by h/o depression and anxiety.    OB History    Gravida  1   Para      Term      Preterm      AB      Living        SAB      TAB      Ectopic      Multiple      Live Births            G1 present - hyperemesis  No abn pap H/o Chl  Past Medical History:  Diagnosis Date  . Chlamydia   . Depression    not taking any meds currently  . Infection    UTI  . Medical history non-contributory   . Ovarian cyst    Past Surgical History:  Procedure Laterality Date  . TONSILLECTOMY     Family History: family history includes Healthy in her father; Hypertension in her mother. Social History:  reports that she has quit smoking. Her smoking use included cigarettes. She has never used smokeless tobacco. She reports previous alcohol use. She reports that she does not use drugs.   Meds Chlorpromazine, protonix, PNV All NKDA     Maternal Diabetes: No Genetic Screening: Normal Maternal Ultrasounds/Referrals: Normal Fetal Ultrasounds or other Referrals:  None Maternal Substance Abuse:  No Significant Maternal Medications:  Meds include: Protonix Other: chlorpromazine Significant Maternal Lab Results:  Group B Strep negative Other Comments:  None  Review of Systems  Constitutional: Negative.   HENT: Negative.   Eyes: Negative.   Respiratory: Negative.   Cardiovascular: Negative.   Gastrointestinal: Positive for abdominal pain, nausea and vomiting.  Genitourinary: Negative.   Musculoskeletal: Negative.   Skin: Negative.   Neurological: Negative.   Psychiatric/Behavioral: Negative.    Maternal Medical History:  Reason for admission: Contractions and nausea.   Contractions: Frequency: regular.    Fetal activity: Perceived fetal activity is normal.     Prenatal complications: Hyperemesis, admitted x 2  Prenatal Complications - Diabetes: none.    Dilation: 4.5 Effacement (%): 100 Station: -1 Exam by:: m wilkins rnc Blood pressure 119/76, pulse 69, temperature 97.6 F (36.4 C), temperature source Oral, resp. rate 20, height 5\' 3"  (1.6 m), weight 63.2 kg, last menstrual period 08/18/2018, SpO2 99 %. Maternal Exam:  Uterine Assessment: Contraction strength is moderate.  Abdomen: Patient reports no abdominal tenderness. Fundal height is appropriate for gestation.   Estimated fetal weight is 7-7.5#.    Introitus: Normal vulva. Normal vagina.    Physical Exam  Constitutional: She is oriented to person, place, and time. She appears well-developed and well-nourished.  HENT:  Head: Normocephalic and atraumatic.  Cardiovascular: Normal rate and regular rhythm.  Respiratory: Effort normal and breath sounds normal. No respiratory distress. She has no wheezes.  GI: Soft. Bowel sounds are normal. She exhibits no distension. There is no abdominal tenderness.  Genitourinary:    Vulva normal.   Musculoskeletal: Normal range of motion.  Neurological: She is alert and oriented to person, place, and time.  Skin: Skin is warm and dry.  Psychiatric: She has a normal mood and affect. Her behavior is normal.    Prenatal labs: ABO, Rh: --/--/A POS, A POS Performed [facility], [address] [place],  [address]  [phone]) Antibody: NEG (07/15 7741) Rubella: Immune (01/14 0000) RPR: Nonreactive (01/14 0000)  HBsAg: Negative (01/14 0000)  HIV: Non-reactive (01/14 0000)  GBS: Negative (06/30 0000)  Hgb 13.9/Plt 279/ Ur Cx neg/GC neg/Chl neg/ Varicella Immune/ Nl Hgb electro WNL/First trimester WNL/glucola 126/   Nl anat, ant plac Completes anat  Assessment/Plan: 23yo G1P0 at 38+ with N/V and cervical change Epidural for pain gbbs no prophylaxis Pitocin and AROM to augment   Kara Barron 05/12/2019, 3:17  PM

## 2019-05-12 NOTE — MAU Note (Signed)
Pt reports to MAU c/o ctx every 5 min or less. No bleeding or LOF. +FM.

## 2019-05-12 NOTE — Anesthesia Preprocedure Evaluation (Signed)
Anesthesia Evaluation  Patient identified by MRN, date of birth, ID band Patient awake    Reviewed: Allergy & Precautions, Patient's Chart, lab work & pertinent test results  Airway Mallampati: I       Dental no notable dental hx.    Pulmonary former smoker,    Pulmonary exam normal        Cardiovascular Normal cardiovascular exam     Neuro/Psych Depression    GI/Hepatic   Endo/Other    Renal/GU      Musculoskeletal   Abdominal   Peds  Hematology   Anesthesia Other Findings   Reproductive/Obstetrics (+) Pregnancy                             Anesthesia Physical Anesthesia Plan  ASA: II  Anesthesia Plan: Epidural   Post-op Pain Management:    Induction:   PONV Risk Score and Plan:   Airway Management Planned:   Additional Equipment:   Intra-op Plan:   Post-operative Plan:   Informed Consent: I have reviewed the patients History and Physical, chart, labs and discussed the procedure including the risks, benefits and alternatives for the proposed anesthesia with the patient or authorized representative who has indicated his/her understanding and acceptance.       Plan Discussed with:   Anesthesia Plan Comments: (COVID-19 Labs  No results for input(s): DDIMER, FERRITIN, LDH, CRP in the last 72 hours.  Lab Results      Component                Value               Date                      SARSCOV2NAA              NEGATIVE            05/12/2019            Lab Results      Component                Value               Date                      WBC                      9.8                 05/12/2019                HGB                      11.2 (L)            05/12/2019                HCT                      34.8 (L)            05/12/2019                MCV                      82.9  05/12/2019                PLT                      342                  05/12/2019           )        Anesthesia Quick Evaluation

## 2019-05-12 NOTE — Anesthesia Procedure Notes (Signed)
Epidural Patient location during procedure: OB Start time: 05/12/2019 10:27 AM End time: 05/12/2019 10:33 AM  Staffing Anesthesiologist: Effie Berkshire, MD Performed: anesthesiologist   Preanesthetic Checklist Completed: patient identified, site marked, surgical consent, pre-op evaluation, timeout performed, IV checked, risks and benefits discussed and monitors and equipment checked  Epidural Patient position: sitting Prep: ChloraPrep Patient monitoring: heart rate, continuous pulse ox and blood pressure Approach: midline Location: L3-L4 Injection technique: LOR saline  Needle:  Needle type: Tuohy  Needle gauge: 17 G Needle length: 9 cm Catheter type: closed end flexible Catheter size: 20 Guage Test dose: negative and 1.5% lidocaine  Assessment Events: blood not aspirated, injection not painful, no injection resistance and no paresthesia  Additional Notes LOR @ 4  Patient identified. Risks/Benefits/Options discussed with patient including but not limited to bleeding, infection, nerve damage, paralysis, failed block, incomplete pain control, headache, blood pressure changes, nausea, vomiting, reactions to medications, itching and postpartum back pain. Confirmed with bedside nurse the patient's most recent platelet count. Confirmed with patient that they are not currently taking any anticoagulation, have any bleeding history or any family history of bleeding disorders. Patient expressed understanding and wished to proceed. All questions were answered. Sterile technique was used throughout the entire procedure. Please see nursing notes for vital signs. Test dose was given through epidural catheter and negative prior to continuing to dose epidural or start infusion. Warning signs of high block given to the patient including shortness of breath, tingling/numbness in hands, complete motor block, or any concerning symptoms with instructions to call for help. Patient was given instructions  on fall risk and not to get out of bed. All questions and concerns addressed with instructions to call with any issues or inadequate analgesia.    Reason for block:procedure for pain

## 2019-05-13 LAB — CBC
HCT: 26.2 % — ABNORMAL LOW (ref 36.0–46.0)
Hemoglobin: 8.5 g/dL — ABNORMAL LOW (ref 12.0–15.0)
MCH: 26.8 pg (ref 26.0–34.0)
MCHC: 32.4 g/dL (ref 30.0–36.0)
MCV: 82.6 fL (ref 80.0–100.0)
Platelets: 235 10*3/uL (ref 150–400)
RBC: 3.17 MIL/uL — ABNORMAL LOW (ref 3.87–5.11)
RDW: 14.2 % (ref 11.5–15.5)
WBC: 15.4 10*3/uL — ABNORMAL HIGH (ref 4.0–10.5)
nRBC: 0 % (ref 0.0–0.2)

## 2019-05-13 LAB — RPR: RPR Ser Ql: NONREACTIVE

## 2019-05-13 NOTE — Progress Notes (Signed)
CSW received consult for hx of Anxiety and Depression.  CSW met with MOB to offer support and complete assessment.    Upon entering the room CSW congratulated MOB and FOB on the birth of infant. CSW advised MOB of the HIPPA policy and was advised that she would like for FOB to remain in the room. CSW advised MOB of the  reason for the visit. MOB reports that she was diagnosed with depression and anxiety at the age of 50. MOB reports that she was on Paxil but stopped once confirming that she was pregnant. CSW was advised that MOB would start medication as needed but is not yet sure if she will restart it just yet. MOB expressed that she has been feeling fine since giving birth but is very tired. MOB asked MOB about SI and HI and MOB expressed neither at this time.   MOB reports having all needed items to care for infant. Infant will sleep in crib once arrived home. MOB has support from family. CSW spoke with MOB about Edinburgh score 11. MOB reports that she is not currently feeling these things but have over the past 7 days  leading up to her giving birth. MOB reports feeling fine overall.   CSW provided education regarding the baby blues period vs. perinatal mood disorders, discussed treatment and gave resources for mental health follow up if concerns arise.  CSW recommends self-evaluation during the postpartum time period using the New Mom Checklist from Postpartum Progress and encouraged MOB to contact a medical professional if symptoms are noted at any time.   CSW provided review of Sudden Infant Death Syndrome (SIDS) precautions.   CSW identifies no further need for intervention and no barriers to discharge at this time.     Virgie Dad Jada Fass, MSW, LCSW [facility] [place] [facility] [place] [phone]

## 2019-05-13 NOTE — Progress Notes (Signed)
PPD #1 No problems Afeb, VSS Fundus firm, NT at U-1 Continue routine postpartum care 

## 2019-05-13 NOTE — Anesthesia Postprocedure Evaluation (Signed)
Anesthesia Post Note  Patient: Pension scheme manager  Procedure(s) Performed: AN AD [place]     Patient location during evaluation: Mother Baby Anesthesia Type: Epidural Level of consciousness: awake, awake and alert and oriented Pain management: pain level controlled Vital Signs Assessment: post-procedure vital signs reviewed and stable Respiratory status: spontaneous breathing and respiratory function stable Cardiovascular status: blood pressure returned to baseline Postop Assessment: no headache, epidural receding, patient able to bend at knees, adequate PO intake, no backache, no apparent nausea or vomiting and able to ambulate Anesthetic complications: no    Last Vitals:  Vitals:   05/13/19 0130 05/13/19 0533  BP: 118/73 107/65  Pulse: 82 63  Resp: 18 18  Temp: 36.8 C 36.9 C  SpO2: 99%     Last Pain:  Vitals:   05/13/19 0533  TempSrc: Oral  PainSc: 0-No pain   Pain Goal:                   Bufford Spikes

## 2019-05-13 NOTE — Lactation Note (Signed)
This note was copied from a baby's chart. Lactation Consultation Note  Patient Name: Kara Barron [id number] Date: 05/13/2019 Reason for consult: Initial assessment;Early term 37-38.6wks;Primapara;1st time breastfeeding  P1 mother whose infant is now 60 hours old.  This is an ETI at 38+1 weeks.  RN requested lactation consult with mother.    Baby was asleep in mother's arms when I arrived.  She is concerned that he has not fed since birth.  He remains very sleepy.  RN has assisted in obtaining colostrum drops which were fed back to baby.  I reassured mother that this can be typical behavior for a baby at this age.  Encouraged her to continue attempting to latch.  Suggested doing a lot of STS, changing diaper prior to latching, stimulating baby to try to arouse him for feedings, hand express to provide colostrum drops prior to latching and to call for latch assistance as needed.  When I questioned mother about her feeding preference she stated, "I want to [place] breast feeding."  She was thinking about asking for formula already and we discussed options to avoid formula.  I offered to initiate the DEBP for mother but she politely refused at this time.  Encouraged her to reconsider when he is 55 hours old if he has not awakened better for latching.  Mother is willing to do this.  RN has provided breast shells and a hand pump which I reminded her to use.  She did not have any further questions regarding these tools.    Mother has a DEBP for home use.  She will notify her RN/LC for latch assistance as needed.  RN updated.    Maternal Data Formula Feeding for Exclusion: No Has patient been taught Hand Expression?: Yes Does the patient have breastfeeding experience prior to this delivery?: No  Feeding Feeding Type: Breast Milk  LATCH Score Latch: Too sleepy or reluctant, no latch achieved, no sucking elicited.                 Interventions    Lactation Tools  Discussed/Used Tools: Shells;Pump Breast pump type: Manual   Consult Status Consult Status: Follow-up Date: 05/14/19 Follow-up type: In-patient    Little Ishikawa 05/13/2019, 12:18 PM

## 2019-05-14 MED ORDER — IBUPROFEN 600 MG PO TABS: 600.0000 mg | ORAL_TABLET | Freq: Four times a day (QID) | ORAL | 1 refills | Status: AC | PRN

## 2019-05-14 MED ORDER — FERROUS FUMARATE 324 (106 FE) MG PO TABS: 1.0000 | ORAL_TABLET | ORAL | 1 refills | Status: AC

## 2019-05-14 NOTE — Addendum Note (Signed)
Addendum  created 05/14/19 0753 by Montez Hageman, MD   [facility] Staff edited

## 2019-05-14 NOTE — Progress Notes (Signed)
Patient ID: Kara Barron, female   DOB: Nov 28, 1995, 23 y.o.   MRN: [id number] Pt doing well. Some vaginal soreness at repair site as expected but tolerable with icepacks and ibuprofen. She is ambulating and voiding well. No fever, HA, CP or SOB. She is bonding well with baby. Tired!. Ready for discharge to home VSS ABD - FF EXT - no homans  A/P: PPD#2 s/p svd 0 - stable         Reviewed discharge instructions- 6 week pp visit         Circ in office

## 2019-05-14 NOTE — Discharge Summary (Signed)
OB Discharge Summary     Patient Name: Kara Barron DOB: 1996/07/31 MRN: [id number]  Date of admission: 05/12/2019 Delivering MD: Janyth Contes   Date of discharge: 05/14/2019  Admitting diagnosis: 38 WKS, CTX Intrauterine pregnancy: [redacted]w[redacted]d     Secondary diagnosis:  Principal Problem:   SVD (spontaneous vaginal delivery) Active Problems:   Normal labor  Additional problems: anemia     Discharge diagnosis: Term Pregnancy Delivered                                                                                                Post partum procedures:none  Augmentation: AROM and Pitocin  Complications: None  Hospital course:  Onset of Labor With Vaginal Delivery     23 y.o. yo G1P1001 at [redacted]w[redacted]d was admitted in Latent Labor on 05/12/2019. Patient had an uncomplicated labor course as follows:  Membrane Rupture Time/Date: 3:24 PM ,05/12/2019   Intrapartum Procedures: Episiotomy: None [1]                                         Lacerations:  1st degree [2]  Patient had a delivery of a Viable infant. 05/12/2019  Information for the patient's newborn:  Tessia, Kassin [[id number]]  Delivery Method: Vaginal, Spontaneous(Filed from Delivery Summary)     Pateint had an uncomplicated postpartum course.  She is ambulating, tolerating a regular diet, passing flatus, and urinating well. Patient is discharged home in stable condition on 05/14/19.   Physical exam  Vitals:   05/13/19 0900 05/13/19 1334 05/13/19 2206 05/14/19 0500  BP: 125/85 112/75 (!) 126/100 122/86  Pulse: 61 74 72 76  Resp: 16 18 18 18   Temp: 98 F (36.7 C) 98.5 F (36.9 C) 97.7 F (36.5 C) 98.1 F (36.7 C)  TempSrc: Oral Oral Axillary Axillary  SpO2: 100% 99%    Weight:      Height:       General: alert, cooperative and no distress Lochia: appropriate Uterine Fundus: firm Incision: N/A DVT Evaluation: No evidence of DVT seen on physical exam. Labs: Lab Results  Component Value Date   WBC  15.4 (H) 05/13/2019   HGB 8.5 (L) 05/13/2019   HCT 26.2 (L) 05/13/2019   MCV 82.6 05/13/2019   PLT 235 05/13/2019   CMP Latest Ref Rng & Units 01/08/2019  Glucose 70 - 99 mg/dL 117(H)  BUN 6 - 20 mg/dL -  Creatinine 0.44 - 1.00 mg/dL -  Sodium 135 - 145 mmol/L -  Potassium 3.5 - 5.1 mmol/L -  Chloride 98 - 111 mmol/L -  CO2 22 - 32 mmol/L -  Calcium 8.9 - 10.3 mg/dL -  Total Protein 6.5 - 8.1 g/dL -  Total Bilirubin 0.3 - 1.2 mg/dL -  Alkaline Phos 38 - 126 U/L -  AST 15 - 41 U/L -  ALT 0 - 44 U/L -    Discharge instruction: per After Visit Summary and "Baby and Me Booklet".  After visit meds:  Allergies as of 05/14/2019  No Known Allergies     Medication List    STOP taking these medications   chlorproMAZINE 10 MG tablet Commonly known as: THORAZINE     TAKE these medications   calcium carbonate 500 MG chewable tablet Commonly known as: TUMS - dosed in mg elemental calcium Chew 2 tablets by mouth daily as needed for indigestion or heartburn.   Ferrous Fumarate 324 (106 Fe) MG Tabs tablet Commonly known as: HEMOCYTE - 106 mg FE Take 1 tablet (106 mg of iron total) by mouth 3 (three) times a week.   ibuprofen 600 MG tablet Commonly known as: ADVIL Take 1 tablet (600 mg total) by mouth every 6 (six) hours as needed for moderate pain or cramping.   ondansetron 8 MG disintegrating tablet Commonly known as: Zofran ODT Take 1 tablet (8 mg total) by mouth every 8 (eight) hours as needed for nausea or vomiting.   pantoprazole 40 MG tablet Commonly known as: PROTONIX Take 1 tablet (40 mg total) by mouth 2 (two) times daily.       Diet: routine diet  Activity: Advance as tolerated. Pelvic rest for 6 weeks.   Outpatient follow up:6 weeks Follow up Appt:No future appointments. Follow up Visit:No follow-ups on file.  Postpartum contraception: Not Discussed  Newborn Data: Live born female  Birth Weight: 7 lb 6.2 oz (3351 g) APGAR: 9, 9  Newborn Delivery    Birth date/time: 05/12/2019 17:42:00 Delivery type: Vaginal, Spontaneous      Baby Feeding: Breast Disposition:home with mother   05/14/2019 Cathrine Musterecilia W Nahzir Pohle, DO

## 2019-05-14 NOTE — Discharge Instructions (Signed)
Call office with any concerns [phone] 

## 2019-05-14 NOTE — Lactation Note (Signed)
This note was copied from a baby's chart. Lactation Consultation Note  Patient Name: Kara Barron [id number] Date: 05/14/2019 Reason for consult: Follow-up assessment Baby is 40 hours/5% weight loss.  Baby has been exclusively formula fed.  Mom states bottles are working better for her.  Offered assist but mom states baby just ate.  Encouraged to call out for lactation assist prior to discharge if desired.  Discussed milk coming to volume and the prevention and treatment of engorgement.  She does have a breast pump at home.  Maternal Data    Feeding Feeding Type: Bottle Fed - Formula  LATCH Score                   Interventions    Lactation Tools Discussed/Used     Consult Status Consult Status: Complete Follow-up type: Call as needed    Ave Filter 05/14/2019, 10:16 AM

## 2019-05-20 ENCOUNTER — Other Ambulatory Visit (HOSPITAL_COMMUNITY)
Admission: RE | Admit: 2019-05-20 | Discharge: 2019-05-20 | Disposition: A | Discharge status: Home / Self Care | Payer: Medicaid Other | Source: Ambulatory Visit

## 2019-05-24 ENCOUNTER — Inpatient Hospital Stay (HOSPITAL_COMMUNITY): Payer: Medicaid Other

## 2019-07-22 ENCOUNTER — Encounter (HOSPITAL_COMMUNITY): Payer: Self-pay | Admitting: *Deleted

## 2019-07-22 ENCOUNTER — Emergency Department (HOSPITAL_COMMUNITY)
Admission: EM | Admit: 2019-07-22 | Discharge: 2019-07-22 | Disposition: A | Discharge status: Home / Self Care | Payer: Medicaid Other | Attending: Emergency Medicine | Admitting: Emergency Medicine

## 2019-07-22 DIAGNOSIS — R197 Diarrhea, unspecified: Secondary | ICD-10-CM | POA: Diagnosis not present

## 2019-07-22 DIAGNOSIS — Z87891 Personal history of nicotine dependence: Secondary | ICD-10-CM | POA: Insufficient documentation

## 2019-07-22 DIAGNOSIS — R112 Nausea with vomiting, unspecified: Secondary | ICD-10-CM | POA: Insufficient documentation

## 2019-07-22 DIAGNOSIS — E860 Dehydration: Secondary | ICD-10-CM | POA: Diagnosis not present

## 2019-07-22 DIAGNOSIS — Z79899 Other long term (current) drug therapy: Secondary | ICD-10-CM | POA: Diagnosis not present

## 2019-07-22 DIAGNOSIS — R1110 Vomiting, unspecified: Secondary | ICD-10-CM | POA: Diagnosis present

## 2019-07-22 LAB — CBC
HCT: 38.1 % (ref 36.0–46.0)
Hemoglobin: 12.4 g/dL (ref 12.0–15.0)
MCH: 26.2 pg (ref 26.0–34.0)
MCHC: 32.5 g/dL (ref 30.0–36.0)
MCV: 80.4 fL (ref 80.0–100.0)
Platelets: 547 10*3/uL — ABNORMAL HIGH (ref 150–400)
RBC: 4.74 MIL/uL (ref 3.87–5.11)
RDW: 16.9 % — ABNORMAL HIGH (ref 11.5–15.5)
WBC: 18.9 10*3/uL — ABNORMAL HIGH (ref 4.0–10.5)
nRBC: 0 % (ref 0.0–0.2)

## 2019-07-22 LAB — COMPREHENSIVE METABOLIC PANEL
ALT: 48 U/L — ABNORMAL HIGH (ref 0–44)
AST: 37 U/L (ref 15–41)
Albumin: 4.5 g/dL (ref 3.5–5.0)
Alkaline Phosphatase: 57 U/L (ref 38–126)
Anion gap: 20 — ABNORMAL HIGH (ref 5–15)
BUN: 10 mg/dL (ref 6–20)
CO2: 15 mmol/L — ABNORMAL LOW (ref 22–32)
Calcium: 9.6 mg/dL (ref 8.9–10.3)
Chloride: 104 mmol/L (ref 98–111)
Creatinine, Ser: 1.07 mg/dL — ABNORMAL HIGH (ref 0.44–1.00)
GFR calc Af Amer: >60 mL/min (ref >60)
GFR calc non Af Amer: >60 mL/min (ref >60)
Glucose, Bld: 187 mg/dL — ABNORMAL HIGH (ref 70–99)
Potassium: 3.5 mmol/L (ref 3.5–5.1)
Sodium: 139 mmol/L (ref 135–145)
Total Bilirubin: 0.9 mg/dL (ref 0.3–1.2)
Total Protein: 7.6 g/dL (ref 6.5–8.1)

## 2019-07-22 LAB — URINALYSIS, ROUTINE W REFLEX MICROSCOPIC
Bilirubin Urine: NEGATIVE
Glucose, UA: NEGATIVE mg/dL
Ketones, ur: 80 mg/dL — AB
Nitrite: NEGATIVE
Protein, ur: 100 mg/dL — AB
Specific Gravity, Urine: 1.025 (ref 1.005–1.030)
pH: 6 (ref 5.0–8.0)

## 2019-07-22 LAB — I-STAT BETA HCG BLOOD, ED (MC, WL, AP ONLY): I-stat hCG, quantitative: <5 m[IU]/mL (ref <5)

## 2019-07-22 LAB — LIPASE, BLOOD: Lipase: 20 U/L (ref 11–51)

## 2019-07-22 MED ORDER — ONDANSETRON 4 MG PO TBDP
4.0000 mg | ORAL_TABLET | Freq: Once | ORAL | Status: AC | PRN
  Administered 2019-07-22: 4 mg via ORAL
  Filled 2019-07-22: qty 1

## 2019-07-22 MED ORDER — SODIUM CHLORIDE 0.9% FLUSH
3.0000 mL | Freq: Once | INTRAVENOUS | Status: AC
  Administered 2019-07-22: 3 mL via INTRAVENOUS

## 2019-07-22 MED ORDER — METOCLOPRAMIDE HCL 5 MG/ML IJ SOLN
5.0000 mg | Freq: Once | INTRAMUSCULAR | Status: AC
  Administered 2019-07-22: 08:00:00 5 mg via INTRAVENOUS
  Filled 2019-07-22: qty 2

## 2019-07-22 MED ORDER — SODIUM CHLORIDE 0.9 % IV BOLUS
1000.0000 mL | Freq: Once | INTRAVENOUS | Status: AC
  Administered 2019-07-22: 08:00:00 1000 mL via INTRAVENOUS

## 2019-07-22 MED ORDER — ONDANSETRON 4 MG PO TBDP: 4.0000 mg | ORAL_TABLET | Freq: Three times a day (TID) | ORAL | 0 refills | Status: AC | PRN

## 2019-07-22 NOTE — ED Notes (Signed)
Patient unable to get a urine sample at present

## 2019-07-22 NOTE — ED Notes (Signed)
Patient provided ginger ale for PO challenge - tolerated well, no vomiting since given anti-emetic earlier.

## 2019-07-22 NOTE — ED Provider Notes (Signed)
[facility] EMERGENCY DEPARTMENT Provider Note   CSN: [id number] Arrival date & time: 07/22/19  0327     History   Chief Complaint Chief Complaint  Patient presents with   Emesis    HPI Kara Barron is a 23 y.o. female who presents to ED for evaluation of 12-hour history of nausea, several episodes of nonbloody, nonbilious emesis and diarrhea.  States that she has been unable to eat much for the past several days due to increased stress in her life.  She began feeling sick last night.  She has not tried any over-the-counter medications to help with symptoms.  Patient tearful, states that "I am so dehydrated I just need fluids."  She denies any sick contacts with similar symptoms, prior abdominal surgeries, urinary symptoms, blood in her stool, alcohol, tobacco or other drug use. Denies abdominal pain.     HPI  Past Medical History:  Diagnosis Date   Chlamydia    Depression    not taking any meds currently   Infection    UTI   Medical history non-contributory    Ovarian cyst    SVD (spontaneous vaginal delivery) 05/12/2019    Patient Active Problem List   Diagnosis Date Noted   Normal labor 05/12/2019   SVD (spontaneous vaginal delivery) 05/12/2019   Hyperemesis complicating pregnancy, antepartum 11/29/2018   Hypokalemia due to excessive gastrointestinal loss of potassium 11/29/2018   Hematemesis 11/29/2018    Past Surgical History:  Procedure Laterality Date   TONSILLECTOMY       OB History    Gravida  1   Para  1   Term  1   Preterm      AB      Living  1     SAB      TAB      Ectopic      Multiple  0   Live Births  1            Home Medications    Prior to Admission medications   Medication Sig Start Date End Date Taking? Authorizing Provider  calcium carbonate (TUMS - DOSED IN MG ELEMENTAL CALCIUM) 500 MG chewable tablet Chew 2 tablets by mouth daily as needed for indigestion or heartburn.     [provider]  Ferrous Fumarate (HEMOCYTE - 106 MG FE) 324 (106 Fe) MG TABS tablet Take 1 tablet (106 mg of iron total) by mouth 3 (three) times a week. 05/14/19   Banga, Sharol Given, DO  ibuprofen (ADVIL) 600 MG tablet Take 1 tablet (600 mg total) by mouth every 6 (six) hours as needed for moderate pain or cramping. 05/14/19   Banga, Mare Loan Worema, DO  ondansetron (ZOFRAN ODT) 4 MG disintegrating tablet Take 1 tablet (4 mg total) by mouth every 8 (eight) hours as needed for nausea or vomiting. 07/22/19   Sabine Tenenbaum, PA-C  pantoprazole (PROTONIX) 40 MG tablet Take 1 tablet (40 mg total) by mouth 2 (two) times daily. 12/02/18   Huel Cote, MD    Family History Family History  Problem Relation Age of Onset   Hypertension Mother    Healthy Father     Social History Social History   Tobacco Use   Smoking status: Former Smoker    Types: Cigarettes   Smokeless tobacco: Never Used   Tobacco comment: 2018  Substance Use Topics   Alcohol use: Not Currently   Drug use: Never     Allergies   Patient has no  known allergies.   Review of Systems Review of Systems  Constitutional: Negative for appetite change, chills and fever.  HENT: Negative for ear pain, rhinorrhea, sneezing and sore throat.   Eyes: Negative for photophobia and visual disturbance.  Respiratory: Negative for cough, chest tightness, shortness of breath and wheezing.   Cardiovascular: Negative for chest pain and palpitations.  Gastrointestinal: Positive for diarrhea, nausea and vomiting. Negative for abdominal pain, blood in stool and constipation.  Genitourinary: Negative for dysuria, hematuria and urgency.  Musculoskeletal: Negative for myalgias.  Skin: Negative for rash.  Neurological: Negative for dizziness, weakness and light-headedness.     Physical Exam Updated Vital Signs BP (!) 146/103    Pulse 65    Temp 98.9 F (37.2 C) (Oral)    Resp 16    Ht 5\' 3"  (1.6 m)    Wt 51.3 kg    LMP  07/19/2019 (Approximate)    SpO2 98%    BMI 20.02 kg/m   Physical Exam Vitals signs and nursing note reviewed.  Constitutional:      General: She is not in acute distress.    Appearance: She is well-developed.  HENT:     Head: Normocephalic and atraumatic.     Nose: Nose normal.  Eyes:     General: No scleral icterus.       Left eye: No discharge.     Conjunctiva/sclera: Conjunctivae normal.  Neck:     Musculoskeletal: Normal range of motion and neck supple.  Cardiovascular:     Rate and Rhythm: Normal rate and regular rhythm.     Heart sounds: Normal heart sounds. No murmur. No friction rub. No gallop.   Pulmonary:     Effort: Pulmonary effort is normal. No respiratory distress.     Breath sounds: Normal breath sounds.  Abdominal:     General: Bowel sounds are normal. There is no distension.     Palpations: Abdomen is soft.     Tenderness: There is no abdominal tenderness. There is no guarding.  Musculoskeletal: Normal range of motion.  Skin:    General: Skin is warm and dry.     Findings: No rash.  Neurological:     Mental Status: She is alert.     Motor: No abnormal muscle tone.     Coordination: Coordination normal.      ED Treatments / Results  Labs (all labs ordered are listed, but only abnormal results are displayed) Labs Reviewed  COMPREHENSIVE METABOLIC PANEL - Abnormal; Notable for the following components:      Result Value   CO2 15 (*)    Glucose, Bld 187 (*)    Creatinine, Ser 1.07 (*)    ALT 48 (*)    Anion gap 20 (*)    All other components within normal limits  CBC - Abnormal; Notable for the following components:   WBC 18.9 (*)    RDW 16.9 (*)    Platelets 547 (*)    All other components within normal limits  URINALYSIS, ROUTINE W REFLEX MICROSCOPIC - Abnormal; Notable for the following components:   Hgb urine dipstick LARGE (*)    Ketones, ur 80 (*)    Protein, ur 100 (*)    Leukocytes,Ua TRACE (*)    Bacteria, UA RARE (*)    All other  components within normal limits  LIPASE, BLOOD  I-STAT BETA HCG BLOOD, ED ([facility], [facility], [facility] ONLY)    EKG None  Radiology No results found.  Procedures Procedures (including critical  care time)  Medications Ordered in ED Medications  sodium chloride flush (NS) 0.9 % injection 3 mL (3 mLs Intravenous Given 07/22/19 0751)  ondansetron (ZOFRAN-ODT) disintegrating tablet 4 mg (4 mg Oral Given 07/22/19 0353)  sodium chloride 0.9 % bolus 1,000 mL (0 mLs Intravenous Stopped 07/22/19 0953)  metoCLOPramide (REGLAN) injection 5 mg (5 mg Intravenous Given 07/22/19 0751)     Initial Impression / Assessment and Plan / ED Course  I have reviewed the triage vital signs and the nursing notes.  Pertinent labs & imaging results that were available during my care of the patient were reviewed by me and considered in my medical decision making (see chart for details).  Clinical Course as of Jul 21 1225  Thu Jul 22, 2019  1140 Patient seen by myself as well as PA provider.  Briefly this is a 23 year old female with a history of anxiety presenting to the emergency department with nausea vomiting and diarrhea.  She reports she has been eating much this way because she is been feeling stressed.  She reports she had a baby 2 months ago and a lot is piling up on her plate.  However yesterday she attempted to eat a [employer] sandwich which  consisted of cold cut meat and mayonnaise.  Within 10 minutes of eating a sandwich she began to feel nauseated and vomited.  She thereafter began having diarrhea.  She had multiple episodes over the course of the past 24 hours.  Her diarrhea is nonbloody.  She is having difficulty keeping down any fluids.  She denies it is never happened to her before.  She denies any fevers or chills.  She denies any abdominal pain.  She denies history of abdominal surgery.  She denies any dysuria or hematuria.  Here in the ED on my initial assessment the patient appears quite comfortable in the room.  She  states that her nausea has improved with her IV medications.  Her abdominal exam is completely benign she demonstrates no tenderness whatsoever in her right lower quadrant suggestive of appendicitis.  I suspect this most likely related to a foodborne illness versus viral gastroenteritis.    [MT]  1142 I reviewed her lab work with her, including the bump in her creatinine, and explained that she would need to stay hydrated with water at home.  She understands this.  I also discussed her leukocytosis, which explained can be a marker of either inflammation or infection.  However given her benign exam and her history, I still believe this most consistent with a viral illness and have a lower suspicion for acute surgical or intra-abdominal bacterial infection.  I advised her that if her symptoms do not improve in the next several days, or if she develops fevers or pain in her right lower quadrant she should come back to the emergency department.  This may be signs of developing infection such as appendicitis.  She verbalizes understanding.    [MT]  1143 We will p.o. challenge her and discharge her.   [MT]  1222 Squamous Epithelial / LPF: 21-50 [MT]  1222 Bacteria, UA(!): RARE [MT]  1222 Suspected contaminated specimen, and no urinary symptoms.  Would not treat for UTI   [MT]    Clinical Course User Index [MT] Trifan, Kermit BaloMatthew J, MD       23 year old female presents to ED for nausea, vomiting and diarrhea since last night.  She denies any abdominal pain.  States that she feels like she is dehydrated. Unsure if this  was caused by the sandwich she ate from a restaurant yesterday.  She does admit to decreased appetite and decreased p.o. intake secondary to increased stressors for the past several days.  She denies any urinary symptoms, sick contacts with similar symptoms.  On my exam patient appears uncomfortable, dry heaving.  Abdomen is soft, nontender nondistended.  She is afebrile.  Lab work significant  for leukocytosis of 18.9, anion gap of 20, creatinine of 1.07.  She was given 1 L of fluid, Zofran and Reglan with significant improvement in her nausea and vomiting.  hCG is negative. UA with some leukocytes and bacteria, but patient symptomatic. Patient able to tolerate PO intake without difficulty prior to discharge.  Suspect viral cause of her symptoms as her repeat abdominal exams have been benign throughout her ED visit, she continues to deny abdominal pain. Did consider appendicitis, cholecystitis but low suspicion. Will have her return if she develops pain or ongoing nausea, vomiting.  Patient is hemodynamically stable, in NAD, and able to ambulate in the ED. Evaluation does not show pathology that would require ongoing emergent intervention or inpatient treatment. I explained the diagnosis to the patient. Pain has been managed and has no complaints prior to discharge. Patient is comfortable with above plan and is stable for discharge at this time. All questions were answered prior to disposition. Strict return precautions for returning to the ED were discussed. Encouraged follow up with PCP.   An After Visit Summary was printed and given to the patient.   Portions of this note were generated with Scientist, clinical (histocompatibility and immunogenetics). Dictation errors may occur despite best attempts at proofreading.   Final Clinical Impressions(s) / ED Diagnoses   Final diagnoses:  Dehydration  Nausea vomiting and diarrhea    ED Discharge Orders         Ordered    ondansetron (ZOFRAN ODT) 4 MG disintegrating tablet  Every 8 hours PRN     07/22/19 1200           Dietrich Pates, PA-C 07/22/19 1226    Terald Sleeper, MD 07/22/19 1721

## 2019-07-22 NOTE — ED Notes (Signed)
Patient verbalized understanding of discharge instructions and denies any further needs or questions at this time. VS stable. Patient ambulatory with steady gait.  

## 2019-07-22 NOTE — Discharge Instructions (Signed)
Take the Zofran as needed for nausea. Return to the ED if he starts to develop abdominal pain, develop a fever, increased vomiting despite the use of medication.

## 2019-07-22 NOTE — ED Notes (Signed)
Called main lab regarding patient's U/A - state they never received urine from her. EMT witnessed to have sent her urine down. Patient attempting to give another urine sample at this time.

## 2019-07-22 NOTE — ED Triage Notes (Signed)
C/o nausea vomiting onset 830 pm , c/o diarrhea also.

## 2019-11-08 ENCOUNTER — Ambulatory Visit: Payer: Medicaid Other | Attending: Internal Medicine

## 2019-11-08 DIAGNOSIS — Z20822 Contact with and (suspected) exposure to covid-19: Secondary | ICD-10-CM

## 2019-11-09 LAB — NOVEL CORONAVIRUS, NAA: SARS-CoV-2, NAA: NOT DETECTED

## 2020-01-24 IMAGING — US US OB COMP LESS 14 WK
1 series · 15 of 22 positions shown · non-contrast
Comparison: None.

CLINICAL DATA: Pelvic pain for 2 weeks. Gestational age by LMP of 9
weeks 1 day.

EXAM:
OBSTETRIC <14 WK ULTRASOUND
TECHNIQUE: Transabdominal ultrasound was performed for evaluation of the
gestation as well as the maternal uterus and adnexal regions.

[Series 1: us ob comp less 14 wk · 22 acquisitions, 15 frames shown]
[im 1/22]
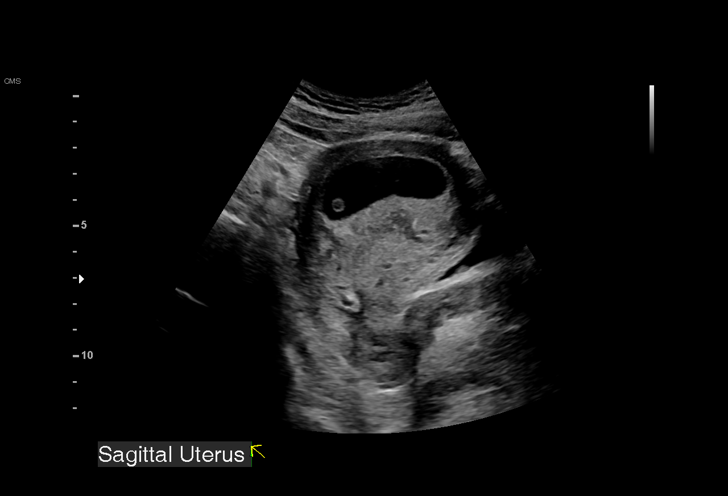
[im 3/22]
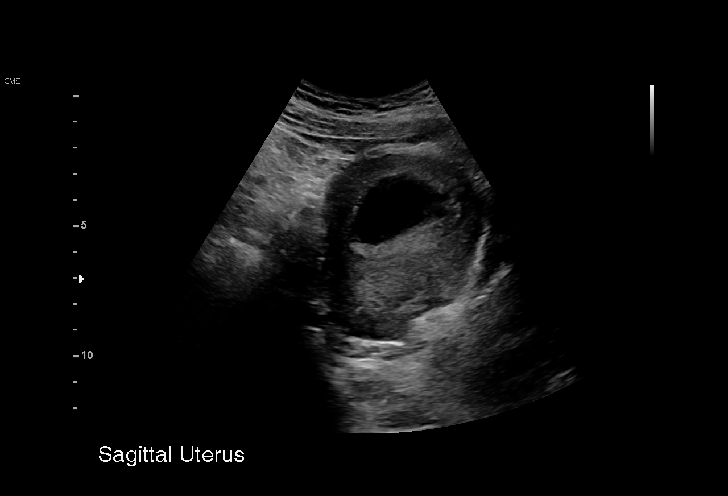
[im 4/22]
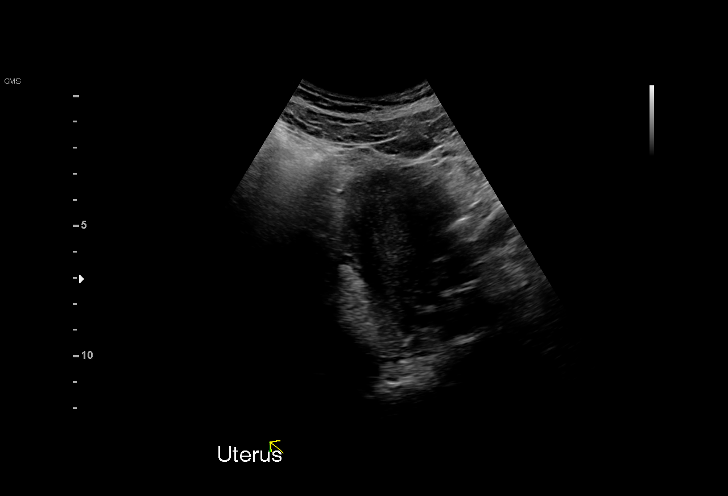
[im 6/22]
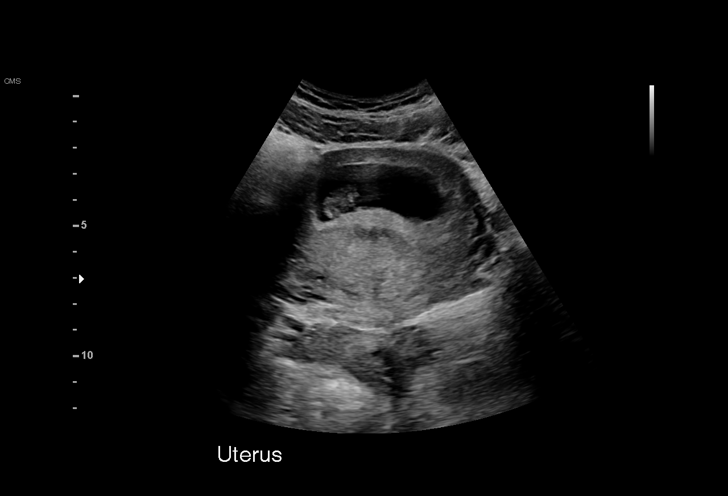
[im 7/22]
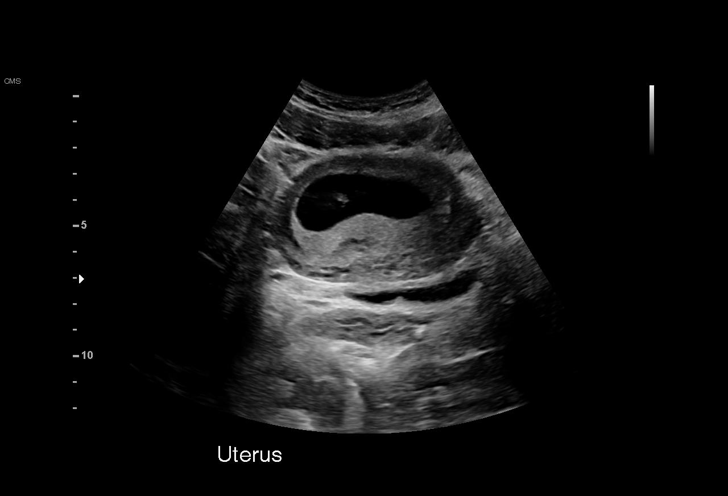
[im 9/22]
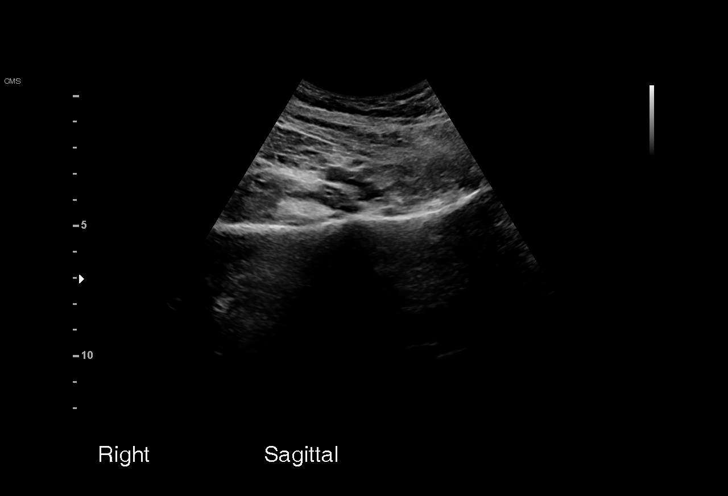
[im 10/22]
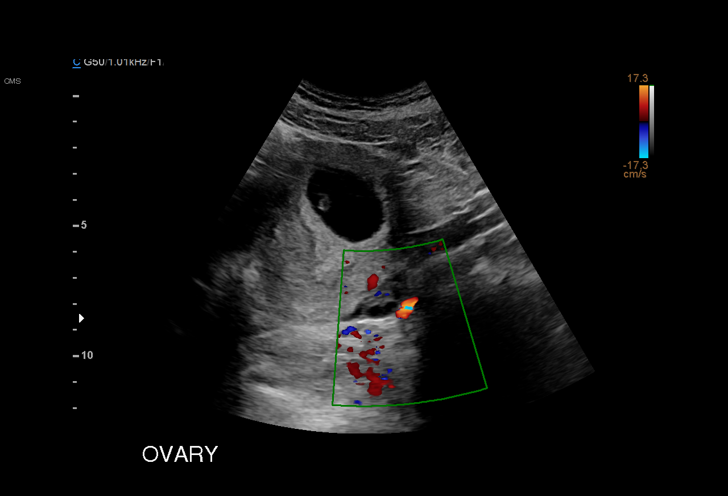
[im 12/22]
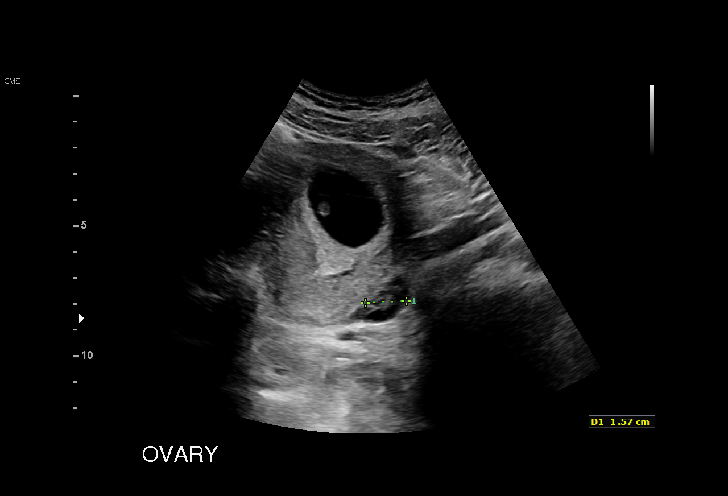
[im 13/22]
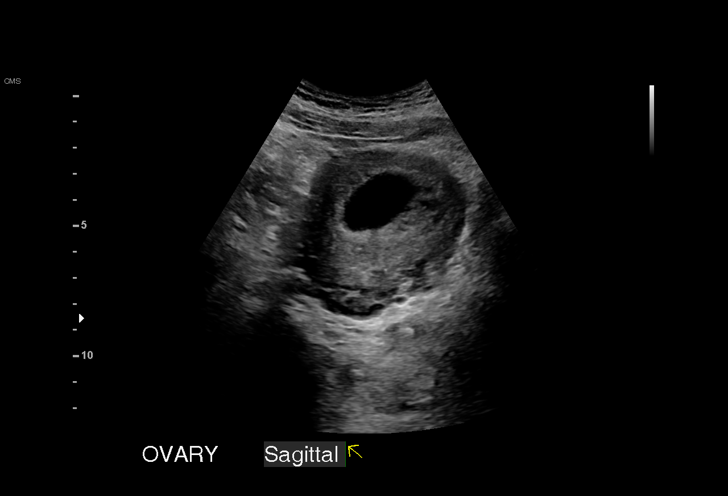
[im 14/22]
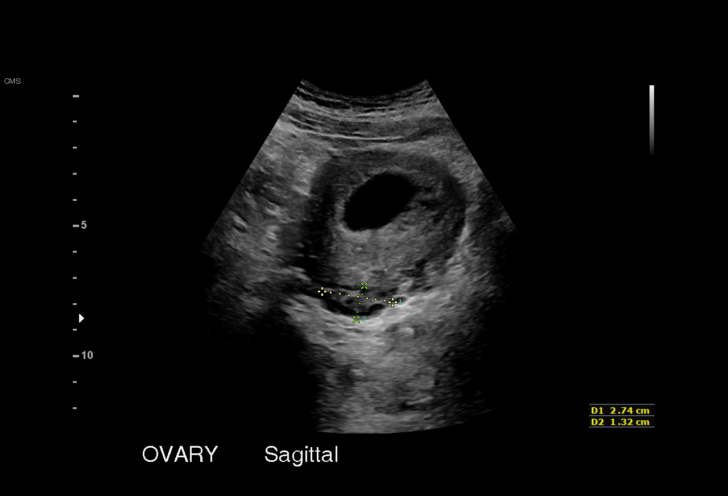
[im 16/22]
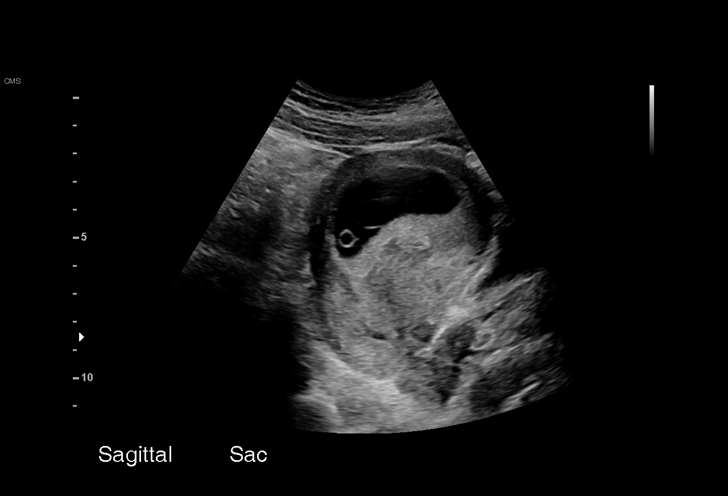
[im 17/22]
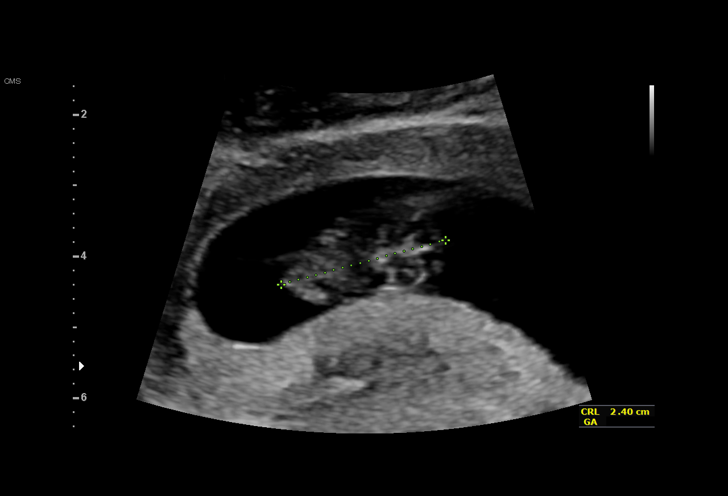
[im 19/22]
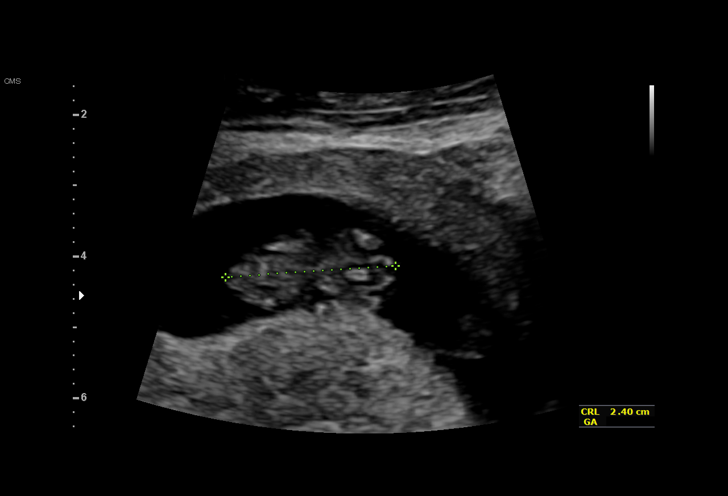
[im 20/22]
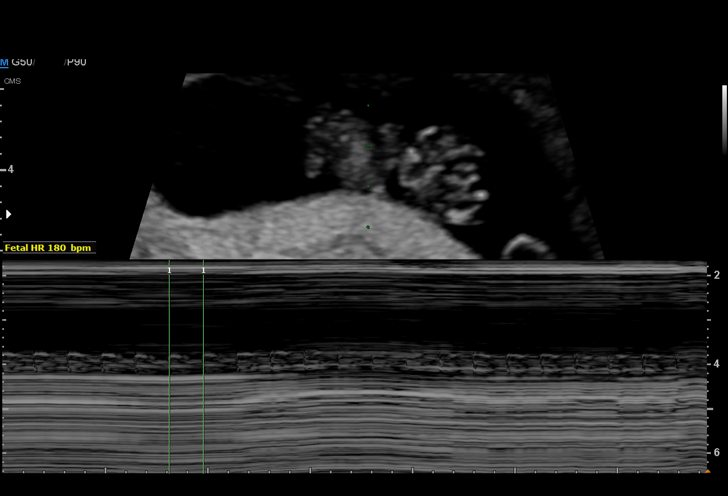
[im 22/22]
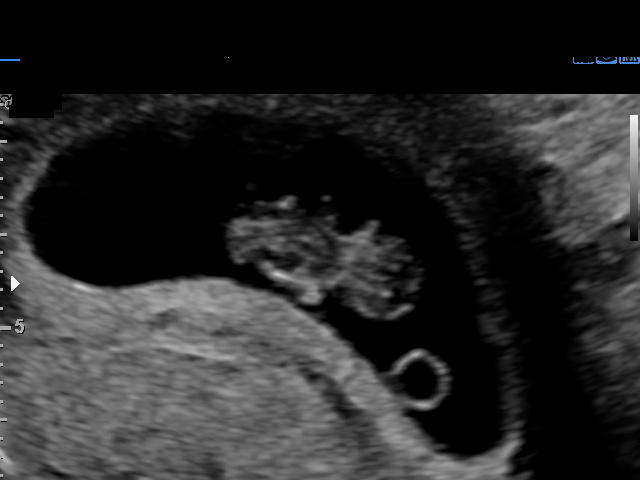

[15 of 22 positions shown; findings below may reference images not displayed]

FINDINGS: Intrauterine gestational sac: Single

Yolk sac:  Visualized.

Embryo:  Visualized.

Cardiac Activity: Visualized.

Heart Rate: 180 bpm

CRL:   24 mm   9 w 1 d                  US EDC: 05/25/2019

Subchorionic hemorrhage:  None visualized.

Maternal uterus/adnexae: Normal appearance of left ovary. Right
ovary not directly visualized, however no mass or abnormal free
fluid identified.
IMPRESSION: Single living IUP measuring 9 weeks 1 day, with US EDC of
05/25/2019.

No significant maternal uterine or adnexal abnormality identified.
